# Patient Record
Sex: Female | Born: 1940 | Race: White | Hispanic: No | Smoking: Current every day smoker
Health system: Southern US, Community
[De-identification: ages and names within clinical notes are randomized; demographics above are authoritative.]

## PROBLEM LIST (undated history)

## (undated) DIAGNOSIS — I2510 Atherosclerotic heart disease of native coronary artery without angina pectoris: Secondary | ICD-10-CM

## (undated) DIAGNOSIS — E7800 Pure hypercholesterolemia, unspecified: Secondary | ICD-10-CM

## (undated) DIAGNOSIS — H25013 Cortical age-related cataract, bilateral: Secondary | ICD-10-CM

## (undated) DIAGNOSIS — F419 Anxiety disorder, unspecified: Secondary | ICD-10-CM

## (undated) DIAGNOSIS — R0600 Dyspnea, unspecified: Secondary | ICD-10-CM

## (undated) DIAGNOSIS — N1831 Chronic kidney disease, stage 3a: Secondary | ICD-10-CM

## (undated) DIAGNOSIS — E039 Hypothyroidism, unspecified: Secondary | ICD-10-CM

## (undated) DIAGNOSIS — I779 Disorder of arteries and arterioles, unspecified: Secondary | ICD-10-CM

## (undated) DIAGNOSIS — G893 Neoplasm related pain (acute) (chronic): Secondary | ICD-10-CM

## (undated) DIAGNOSIS — L409 Psoriasis, unspecified: Secondary | ICD-10-CM

## (undated) DIAGNOSIS — L4050 Arthropathic psoriasis, unspecified: Secondary | ICD-10-CM

## (undated) DIAGNOSIS — C4490 Unspecified malignant neoplasm of skin, unspecified: Secondary | ICD-10-CM

## (undated) HISTORY — DX: Arthropathic psoriasis, unspecified: L40.50

## (undated) HISTORY — DX: Pure hypercholesterolemia, unspecified: E78.00

## (undated) HISTORY — DX: Psoriasis, unspecified: L40.9

## (undated) HISTORY — PX: VAGINAL HYSTERECTOMY: SHX2639

## (undated) HISTORY — DX: Hypothyroidism, unspecified: E03.9

## (undated) HISTORY — DX: Neoplasm related pain (acute) (chronic): G89.3

## (undated) HISTORY — PX: CHOLECYSTECTOMY: SHX55

## (undated) HISTORY — DX: Unspecified malignant neoplasm of skin, unspecified: C44.90

## (undated) HISTORY — DX: Atherosclerotic heart disease of native coronary artery without angina pectoris: I25.10

---
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
## 1984-04-10 HISTORY — PX: LIVER BIOPSY: SHX301

## 1986-04-10 HISTORY — PX: CARPAL TUNNEL RELEASE: SHX101

## 1995-04-11 HISTORY — PX: STRABISMUS SURGERY: SHX218

## 1995-04-11 HISTORY — PX: CATARACT EXTRACTION, BILATERAL: SHX1313

## 2004-09-13 ENCOUNTER — Ambulatory Visit: Payer: Self-pay | Admitting: Internal Medicine

## 2005-11-21 ENCOUNTER — Ambulatory Visit: Payer: Self-pay | Admitting: Internal Medicine

## 2006-01-02 ENCOUNTER — Ambulatory Visit: Payer: Self-pay | Admitting: Internal Medicine

## 2006-04-10 HISTORY — PX: COLONOSCOPY: SHX174

## 2006-05-09 ENCOUNTER — Ambulatory Visit: Payer: Self-pay | Admitting: Unknown Physician Specialty

## 2007-01-09 ENCOUNTER — Ambulatory Visit: Payer: Self-pay | Admitting: Internal Medicine

## 2008-03-26 ENCOUNTER — Ambulatory Visit: Payer: Self-pay | Admitting: Internal Medicine

## 2009-02-25 ENCOUNTER — Ambulatory Visit: Payer: Self-pay | Admitting: Ophthalmology

## 2009-03-11 ENCOUNTER — Ambulatory Visit: Payer: Self-pay | Admitting: Ophthalmology

## 2009-04-27 ENCOUNTER — Ambulatory Visit: Payer: Self-pay | Admitting: Internal Medicine

## 2009-05-04 ENCOUNTER — Ambulatory Visit: Payer: Self-pay | Admitting: Ophthalmology

## 2010-03-22 ENCOUNTER — Ambulatory Visit: Payer: Self-pay | Admitting: Internal Medicine

## 2010-05-02 ENCOUNTER — Ambulatory Visit: Payer: Self-pay | Admitting: Internal Medicine

## 2010-05-20 ENCOUNTER — Ambulatory Visit: Payer: Self-pay | Admitting: Unknown Physician Specialty

## 2010-12-26 ENCOUNTER — Ambulatory Visit: Payer: Self-pay | Admitting: Internal Medicine

## 2011-05-15 ENCOUNTER — Ambulatory Visit: Payer: Self-pay | Admitting: Internal Medicine

## 2011-06-22 ENCOUNTER — Ambulatory Visit: Payer: Self-pay | Admitting: Internal Medicine

## 2013-05-19 ENCOUNTER — Ambulatory Visit: Payer: Self-pay | Admitting: Internal Medicine

## 2014-03-03 DIAGNOSIS — E0500 Thyrotoxicosis with diffuse goiter without thyrotoxic crisis or storm: Secondary | ICD-10-CM | POA: Insufficient documentation

## 2014-03-03 DIAGNOSIS — L4050 Arthropathic psoriasis, unspecified: Secondary | ICD-10-CM | POA: Insufficient documentation

## 2014-03-03 DIAGNOSIS — E7800 Pure hypercholesterolemia, unspecified: Secondary | ICD-10-CM | POA: Insufficient documentation

## 2014-03-11 ENCOUNTER — Ambulatory Visit: Payer: Self-pay | Admitting: Internal Medicine

## 2014-03-20 ENCOUNTER — Ambulatory Visit: Payer: Self-pay | Admitting: Internal Medicine

## 2014-04-23 DIAGNOSIS — I779 Disorder of arteries and arterioles, unspecified: Secondary | ICD-10-CM | POA: Insufficient documentation

## 2014-04-23 DIAGNOSIS — Z720 Tobacco use: Secondary | ICD-10-CM | POA: Insufficient documentation

## 2014-06-29 ENCOUNTER — Ambulatory Visit: Payer: Self-pay | Admitting: Internal Medicine

## 2015-04-27 ENCOUNTER — Other Ambulatory Visit: Payer: Self-pay | Admitting: Internal Medicine

## 2015-04-27 DIAGNOSIS — Z1231 Encounter for screening mammogram for malignant neoplasm of breast: Secondary | ICD-10-CM

## 2016-04-10 HISTORY — PX: COLONOSCOPY: SHX174

## 2016-04-10 HISTORY — PX: OTHER SURGICAL HISTORY: SHX169

## 2017-05-29 ENCOUNTER — Other Ambulatory Visit: Payer: Self-pay | Admitting: Internal Medicine

## 2017-05-29 DIAGNOSIS — E038 Other specified hypothyroidism: Secondary | ICD-10-CM | POA: Insufficient documentation

## 2017-05-29 DIAGNOSIS — Z1239 Encounter for other screening for malignant neoplasm of breast: Secondary | ICD-10-CM

## 2017-06-18 ENCOUNTER — Ambulatory Visit
Admission: RE | Admit: 2017-06-18 | Discharge: 2017-06-18 | Disposition: A | Discharge status: Home / Self Care | Payer: Medicare Other | Source: Ambulatory Visit | Attending: Internal Medicine | Admitting: Internal Medicine

## 2017-06-18 DIAGNOSIS — Z1231 Encounter for screening mammogram for malignant neoplasm of breast: Secondary | ICD-10-CM | POA: Insufficient documentation

## 2017-06-18 DIAGNOSIS — Z1239 Encounter for other screening for malignant neoplasm of breast: Secondary | ICD-10-CM

## 2020-04-08 ENCOUNTER — Other Ambulatory Visit: Payer: Self-pay | Admitting: Rheumatology

## 2020-04-08 DIAGNOSIS — R0781 Pleurodynia: Secondary | ICD-10-CM

## 2020-04-08 DIAGNOSIS — M546 Pain in thoracic spine: Secondary | ICD-10-CM

## 2020-04-22 ENCOUNTER — Ambulatory Visit
Admission: RE | Admit: 2020-04-22 | Discharge: 2020-04-22 | Disposition: A | Discharge status: Home / Self Care | Payer: Medicare PPO | Source: Ambulatory Visit | Attending: Rheumatology | Admitting: Rheumatology

## 2020-04-22 ENCOUNTER — Other Ambulatory Visit: Payer: Self-pay

## 2020-04-22 DIAGNOSIS — M546 Pain in thoracic spine: Secondary | ICD-10-CM

## 2020-04-22 DIAGNOSIS — R0781 Pleurodynia: Secondary | ICD-10-CM | POA: Diagnosis present

## 2020-04-22 DIAGNOSIS — C801 Malignant (primary) neoplasm, unspecified: Secondary | ICD-10-CM

## 2020-04-22 HISTORY — DX: Malignant (primary) neoplasm, unspecified: C80.1

## 2020-04-26 ENCOUNTER — Encounter: Payer: Self-pay | Admitting: Rheumatology

## 2020-05-03 ENCOUNTER — Inpatient Hospital Stay: Payer: Medicare PPO

## 2020-05-03 ENCOUNTER — Encounter: Payer: Self-pay | Admitting: Oncology

## 2020-05-03 ENCOUNTER — Inpatient Hospital Stay: Payer: Medicare PPO | Attending: Oncology | Admitting: Oncology

## 2020-05-03 ENCOUNTER — Other Ambulatory Visit: Payer: Self-pay

## 2020-05-03 VITALS — BP 152/92 | HR 89 | Temp 99.1°F | Resp 18 | Wt 151.0 lb

## 2020-05-03 DIAGNOSIS — G893 Neoplasm related pain (acute) (chronic): Secondary | ICD-10-CM

## 2020-05-03 DIAGNOSIS — R918 Other nonspecific abnormal finding of lung field: Secondary | ICD-10-CM | POA: Diagnosis present

## 2020-05-03 LAB — COMPREHENSIVE METABOLIC PANEL
ALT: 11 U/L (ref 0–44)
AST: 14 U/L — ABNORMAL LOW (ref 15–41)
Albumin: 3.6 g/dL (ref 3.5–5.0)
Alkaline Phosphatase: 92 U/L (ref 38–126)
Anion gap: 10 (ref 5–15)
BUN: 15 mg/dL (ref 8–23)
CO2: 27 mmol/L (ref 22–32)
Calcium: 9 mg/dL (ref 8.9–10.3)
Chloride: 102 mmol/L (ref 98–111)
Creatinine, Ser: 1.12 mg/dL — ABNORMAL HIGH (ref 0.44–1.00)
GFR, Estimated: 50 mL/min — ABNORMAL LOW (ref >60)
Glucose, Bld: 111 mg/dL — ABNORMAL HIGH (ref 70–99)
Potassium: 4.4 mmol/L (ref 3.5–5.1)
Sodium: 139 mmol/L (ref 135–145)
Total Bilirubin: 0.5 mg/dL (ref 0.3–1.2)
Total Protein: 8 g/dL (ref 6.5–8.1)

## 2020-05-03 LAB — CBC WITH DIFFERENTIAL/PLATELET
Abs Immature Granulocytes: 0.07 10*3/uL (ref 0.00–0.07)
Basophils Absolute: 0.1 10*3/uL (ref 0.0–0.1)
Basophils Relative: 1 %
Eosinophils Absolute: 0.5 10*3/uL (ref 0.0–0.5)
Eosinophils Relative: 4 %
HCT: 38.5 % (ref 36.0–46.0)
Hemoglobin: 12.3 g/dL (ref 12.0–15.0)
Immature Granulocytes: 1 %
Lymphocytes Relative: 14 %
Lymphs Abs: 1.8 10*3/uL (ref 0.7–4.0)
MCH: 27.8 pg (ref 26.0–34.0)
MCHC: 31.9 g/dL (ref 30.0–36.0)
MCV: 87.1 fL (ref 80.0–100.0)
Monocytes Absolute: 0.8 10*3/uL (ref 0.1–1.0)
Monocytes Relative: 7 %
Neutro Abs: 9 10*3/uL — ABNORMAL HIGH (ref 1.7–7.7)
Neutrophils Relative %: 73 %
Platelets: 181 10*3/uL (ref 150–400)
RBC: 4.42 MIL/uL (ref 3.87–5.11)
RDW: 13.2 % (ref 11.5–15.5)
WBC: 12.2 10*3/uL — ABNORMAL HIGH (ref 4.0–10.5)
nRBC: 0 % (ref 0.0–0.2)

## 2020-05-03 MED ORDER — PANTOPRAZOLE SODIUM 20 MG PO TBEC: 20.0000 mg | DELAYED_RELEASE_TABLET | Freq: Every day | ORAL | 1 refills | Status: DC

## 2020-05-03 MED ORDER — DEXAMETHASONE 4 MG PO TABS: 4.0000 mg | ORAL_TABLET | Freq: Two times a day (BID) | ORAL | 1 refills | Status: DC

## 2020-05-03 NOTE — Progress Notes (Signed)
Hematology/Oncology Consult note [facility] Telephone:([phone] [phone]  Patient Care Team: Baxter Hire, MD as PCP - General (Internal Medicine)   Name of the patient: Kylie Washington  [id number]  [birth year]    Reason for referral-lung mass   Referring physician-Dr. Edwina Barth  Date of visit: 05/03/20    Diagnosis-lung mass  Chief complaint/ Reason for visit-Dr. Edwina Barth  Heme/Onc history: Patient is a 80 year old female with a past medical history significant for psoriatic arthritis and hypothyroidism.  She presented with symptoms of Mid back pain which prompted x-rays which led to CT scan.  CT chest without contrast showed subpleural mass in the posterior medial left upper lobe measuring 5.4 x 3.2 x 7.9 cm.  Tumor extends into left T2-T3, T3-T4 and T4-T5 neural foramina.  Mass-effect upon the cord.  Several adjacent satellite nodules identified in the paravertebral left upper lobe.  Bilateral low-attenuation adrenal nodules.  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  Patient is currently on tramadol and uses mainly ibuprofen for her back pain.  She was also prescribed Percocet by Dr. Jefm Bryant but she has not started using it.  Patient denies any numbness around her chest wall.  Denies any weakness in her extremities.  Pain mainly involves around her back and sometimes radiates down to the front and under her left breast.  Has occasional left shoulder pain.    ECOG PS- 1  Pain scale- 5   Review of systems- Review of Systems  Constitutional: Negative for chills, fever, malaise/fatigue and weight loss.  HENT: Negative for congestion, ear discharge and nosebleeds.   Eyes: Negative for blurred vision.  Respiratory: Negative for cough, hemoptysis, sputum production, shortness of breath and wheezing.   Cardiovascular: Negative for chest pain, palpitations, orthopnea and claudication.  Gastrointestinal: Negative for  abdominal pain, blood in stool, constipation, diarrhea, heartburn, melena, nausea and vomiting.  Genitourinary: Negative for dysuria, flank pain, frequency, hematuria and urgency.  Musculoskeletal: Positive for back pain. Negative for joint pain and myalgias.  Skin: Negative for rash.  Neurological: Negative for dizziness, tingling, focal weakness, seizures, weakness and headaches.  Endo/Heme/Allergies: Does not bruise/bleed easily.  Psychiatric/Behavioral: Negative for depression and suicidal ideas. The patient does not have insomnia.     Allergies  Allergen Reactions  . Aurothioglucose Other (See Comments)    Granulocytopenia  . Codeine Nausea Only and Nausea And Vomiting  . Methotrexate Derivatives     Liver enlargement  . Penicillins     Fainting   . Sulfa Antibiotics Nausea Only    Patient Active Problem List   Diagnosis Date Noted  . Other specified hypothyroidism 05/29/2017  . Bilateral carotid artery disease ([place]) 04/23/2014  . Current tobacco use 04/23/2014  . Psoriatic arthritis ([place]) 03/03/2014  . Hypercholesterolemia 03/03/2014  . Graves disease 03/03/2014     Past Medical History:  Diagnosis Date  . Coronary artery disease   . Hypercholesteremia   . Hypothyroidism   . Psoriasis   . Psoriatic arthritis [facility])      Past Surgical History:  Procedure Laterality Date  . APPENDECTOMY     1945  . CARPAL TUNNEL RELEASE  1988  . CATARACT EXTRACTION, BILATERAL  1997  . CHOLECYSTECTOMY    . colonopscopy  2018  . COLONOSCOPY  2008  . LIVER BIOPSY  1986  . TONSILLECTOMY  1945    Social History   Socioeconomic History  . Marital status: Married    Spouse name: Not on file  . Number of  children: Not on file  . Years of education: Not on file  . Highest education level: Not on file  Occupational History  . Not on file  Tobacco Use  . Smoking status: Current Some Day Smoker    Years: 49.00    Types: Cigarettes  . Smokeless tobacco: Never Used   Substance and Sexual Activity  . Alcohol use: Not on file  . Drug use: Not on file  . Sexual activity: Not on file  Other Topics Concern  . Not on file  Social History Narrative  . Not on file   Social Determinants of Health   Financial Resource Strain: Not on file  Food Insecurity: Not on file  Transportation Needs: Not on file  Physical Activity: Not on file  Stress: Not on file  Social Connections: Not on file  Intimate Partner Violence: Not on file     History reviewed. No pertinent family history.   Current Outpatient Medications:  .  dexamethasone (DECADRON) 4 MG tablet, Take 1 tablet (4 mg total) by mouth 2 (two) times daily., Disp: 60 tablet, Rfl: 1 .  ibuprofen (ADVIL) 400 MG tablet, Take 400 mg by mouth every 6 (six) hours as needed., Disp: , Rfl:  .  levothyroxine (SYNTHROID) 88 MCG tablet, TAKE 1 TABLET BY MOUTH ONCE DAILY ON AN EMPTY STOMACH WITH A GLASS OF WATER AT LEAST 30-60 MINUTES BEFORE BREAKFAST, Disp: , Rfl:  .  pantoprazole (PROTONIX) 20 MG tablet, Take 1 tablet (20 mg total) by mouth daily., Disp: 30 tablet, Rfl: 1 .  Artificial Tear Solution (SOOTHE HYDRATION) 1.25 % SOLN, Apply to eye., Disp: , Rfl:  .  fluocinonide gel (LIDEX) 0.05 %, Apply topically., Disp: , Rfl:    Physical exam:  Vitals:   05/03/20 1347  BP: (!) 152/92  Pulse: 89  Resp: 18  Temp: 99.1 F (37.3 C)  TempSrc: Tympanic  SpO2: 98%  Weight: 151 lb (68.5 kg)   Physical Exam Eyes:     Extraocular Movements: EOM normal.  Cardiovascular:     Rate and Rhythm: Normal rate and regular rhythm.     Heart sounds: Normal heart sounds.  Pulmonary:     Effort: Pulmonary effort is normal.     Breath sounds: Normal breath sounds.  Abdominal:     General: Bowel sounds are normal.     Palpations: Abdomen is soft.  Skin:    General: Skin is warm and dry.  Neurological:     General: No focal deficit present.     Mental Status: She is alert and oriented to person, place, and time.      Sensory: No sensory deficit.     Motor: No weakness.        No flowsheet data found. CBC Latest Ref Rng & Units 05/03/2020  WBC 4.0 - 10.5 K/uL 12.2(H)  Hemoglobin 12.0 - 15.0 g/dL 12.3  Hematocrit 36.0 - 46.0 % 38.5  Platelets 150 - 400 K/uL 181    No images are attached to the encounter.  CT CHEST WO CONTRAST  Result Date: 04/22/2020 CLINICAL DATA:  Mid to upper thoracic spine pain. EXAM: CT CHEST WITHOUT CONTRAST TECHNIQUE: Multidetector CT imaging of the chest was performed following the standard protocol without IV contrast. COMPARISON:  None. FINDINGS: Cardiovascular: Normal heart size. No pericardial effusion. Aortic atherosclerosis. Coronary artery calcifications. Mediastinum/Nodes: Thyroid gland appears atrophic. The trachea appears patent and is midline. Normal appearance of the esophagus. No axillary, supraclavicular adenopathy. Prominent AP window lymph node  measures 1.1 cm, image 57/2. Lungs/Pleura: There is a small right pleural effusion. There is a subpleural mass within the posteromedial left upper lobe which measures 5.4 x 3.2 by 7.9 cm. Tumor extends into the left T2/T3, T3-4, and T4-5 neural foramina. FDG dural tumor is identified along the left side of the canal which has mass effect upon the cord. This is most pronounced at the T3-4 level, image 31/2. Several adjacent satellite nodules are identified within the paravertebral left upper lobe, image 42/3 and image 46/3. Mild pleural thickening overlying the posterolateral right upper lobe is identified, nonspecific. This measures up to 4 mm in thickness. Upper Abdomen: Bilateral low-attenuation adrenal nodules are identified suggestive of adenomas. Left adrenal gland nodule measures 1.9 cm and 2.59 Hounsfield units. Right adrenal nodule measures 1.1 cm and 2.6 Hounsfield units. Simple appearing cyst within the posteromedial right hepatic lobe measures 1.8 cm. Musculoskeletal: No aggressive lytic or sclerotic bone lesions  identified. IMPRESSION: 1. Subpleural mass within the paravertebral left upper lobe is identified with extension into the left T2-T3, T3-4, and T4-5 neural foramina. Tumor extends into the left side of the canal which has mass effect upon the cord at these levels. Finding is worrisome for primary bronchogenic carcinoma. Further investigation with contrast enhanced MRI of the thoracic spine is advised. A PET-CT may also be indicated for staging. 2. Small right pleural effusion. 3. Borderline enlarged AP window lymph node.  Nonspecific. 4. Bilateral adrenal gland adenomas. 5. Coronary artery calcifications noted. 6. Aortic atherosclerosis. Aortic Atherosclerosis (ICD10-I70.0). Critical Value/emergent results were called by telephone at the time of interpretation on 04/22/2020 at 3:16 pm to provider Liliane Bade. , who verbally acknowledged these results. Electronically Signed   By: Kerby Moors M.D.   On: 04/22/2020 15:17    Assessment and plan- Patient is a 80 y.o. female with left upper lobe lung mass noted on CT chest with involvement of T2-T5 neural foramina  Clinically patient does not have any evidence of cord compression although there is some evidence of mass-effect from the left upper lobe lung mass upon the cord.  For her pain I have advised her to take Percocet instead of ibuprofen and I will also start her on Decadron 4 mg twice daily along with Protonix 20 mg daily.  I will refer her to radiation oncology as well to get going with the work-up ASAP.  I have reviewed CT chest images independently and discussed findings with the patient and her husband.  Overall findings are concerning for lung cancer with locoregional involvement of the thoracic spinal cord.  We will plan to get a PET CT scan as well as MRI thoracic spine with and without contrast.  She will also need an MRI brain with and without contrast to complete her staging work-up.  She will need CT-guided biopsy of the left upper  lobe lung mass which can be done by interventional radiology.  I will see the patient back after the imaging and biopsy results are back to discuss further management   Thank you for this kind referral and the opportunity to participate in the care of this patient   Visit Diagnosis 1. Lung mass   2. Neoplasm related pain     Dr. Randa Evens, MD, MPH [facility] at [facility] [phone] 05/03/2020  3:07 PM

## 2020-05-04 ENCOUNTER — Other Ambulatory Visit: Payer: Self-pay | Admitting: *Deleted

## 2020-05-04 ENCOUNTER — Encounter: Payer: Self-pay | Admitting: *Deleted

## 2020-05-04 DIAGNOSIS — R918 Other nonspecific abnormal finding of lung field: Secondary | ICD-10-CM

## 2020-05-04 MED ORDER — LORAZEPAM 0.5 MG PO TABS: ORAL_TABLET | ORAL | 0 refills | Status: DC

## 2020-05-04 NOTE — Progress Notes (Signed)
  Oncology Nurse Navigator Documentation  Navigator Location: [facility] (05/04/20 1300)   )Navigator Encounter Type: Telephone (05/04/20 1300) Telephone: Kylie Washington Call;Patient Update;Appt Confirmation/Clarification (05/04/20 1300) Abnormal Finding Date: 03/18/20 (05/04/20 1300)                   Treatment Phase: Abnormal Scans (05/04/20 1300) Barriers/Navigation Needs: Coordination of Care;Pain (05/04/20 1300)   Interventions: Coordination of Care;Referrals (05/04/20 1300) Referrals: Radiation Oncology (05/04/20 1300) Coordination of Care: Appts;Radiology (05/04/20 1300)        Acuity: Level 2-Minimal Needs (1-2 Barriers Identified) (05/04/20 1300)    met patient briefly in the lobby yesterday, 05/03/20, to introduce to navigator services and answer any remained questions. At that time, pt did not have any quesitions. Contact info given and instructed to call with any questions. Pt made aware that will be in contact with her appts once scheduled.   Spoke with pt by phone today to review upcoming appts. All questions answered during call. Informed pt that will follow up with her at her visit tomorrow. Pt verbalized understanding. Nothing further needed at this time.     Time Spent with Patient: 60 (05/04/20 1300)

## 2020-05-05 ENCOUNTER — Other Ambulatory Visit: Payer: Self-pay

## 2020-05-05 ENCOUNTER — Ambulatory Visit
Admission: RE | Admit: 2020-05-05 | Discharge: 2020-05-05 | Disposition: A | Discharge status: Home / Self Care | Payer: Medicare PPO | Source: Ambulatory Visit | Attending: Radiation Oncology | Admitting: Radiation Oncology

## 2020-05-05 ENCOUNTER — Encounter: Payer: Self-pay | Admitting: Radiation Oncology

## 2020-05-05 ENCOUNTER — Encounter: Payer: Self-pay | Admitting: *Deleted

## 2020-05-05 VITALS — BP 140/85 | HR 81 | Temp 97.3°F | Resp 18 | Wt 151.1 lb

## 2020-05-05 DIAGNOSIS — E7800 Pure hypercholesterolemia, unspecified: Secondary | ICD-10-CM | POA: Insufficient documentation

## 2020-05-05 DIAGNOSIS — M546 Pain in thoracic spine: Secondary | ICD-10-CM | POA: Insufficient documentation

## 2020-05-05 DIAGNOSIS — E039 Hypothyroidism, unspecified: Secondary | ICD-10-CM | POA: Diagnosis not present

## 2020-05-05 DIAGNOSIS — I2510 Atherosclerotic heart disease of native coronary artery without angina pectoris: Secondary | ICD-10-CM | POA: Diagnosis not present

## 2020-05-05 DIAGNOSIS — Z79899 Other long term (current) drug therapy: Secondary | ICD-10-CM | POA: Diagnosis not present

## 2020-05-05 DIAGNOSIS — F17210 Nicotine dependence, cigarettes, uncomplicated: Secondary | ICD-10-CM | POA: Diagnosis not present

## 2020-05-05 DIAGNOSIS — R918 Other nonspecific abnormal finding of lung field: Secondary | ICD-10-CM | POA: Diagnosis present

## 2020-05-05 NOTE — Progress Notes (Signed)
  Oncology Nurse Navigator Documentation  Navigator Location: [facility] (05/05/20 1000)   )Navigator Encounter Type: Initial RadOnc (05/05/20 1000)                       Treatment Phase: Pre-Tx/Tx Discussion (05/05/20 1000) Barriers/Navigation Needs: Coordination of Care (05/05/20 1000)   Interventions: Coordination of Care (05/05/20 1000)   Coordination of Care: Appts;Radiology (05/05/20 1000)       met with patient during initial consult with Dr. Baruch Gouty to discuss radiation treatment. All questions answered during visit. Pt given print out of upcoming appts. Appts reviewed with patient. Informed that she will be notified by phone once scheduled for biopsy and follow up visit with Dr. Janese Banks. Nothing further needed at this time. Instructed pt to call with any further questions or needs. Pt verbalized understanding.            Time Spent with Patient: 60 (05/05/20 1000)

## 2020-05-05 NOTE — Consult Note (Signed)
NEW PATIENT EVALUATION  Name: Kylie Washington  MRN: [id number]  Date:   05/05/2020     DOB: October 13, 1940   This 80 y.o. 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  REFERRING PHYSICIAN: Baxter Hire, MD  CHIEF COMPLAINT:  Chief Complaint  Patient presents with  . Lung Cancer    Initial consultation    DIAGNOSIS: The encounter diagnosis was Lung mass.   PREVIOUS INVESTIGATIONS:  CT scans reviewed PET scan CT scan ordered MRI brain ordered Clinical notes reviewed Labs reviewed  HPI: Patient is a 80 year old female who presented with mid back pain which led to x-rays.  CT scan this month showed a subpleural mass within the paravertebral left upper lobe with extension and T2-T5 neuroforamina.  Tumor extended into the left side of the canal which has a mass-effect upon the cord at these levels findings worrisome for primary bronchogenic carcinoma.  She had a borderline enlarged AP window lymph nodes.  She has a PET CT scan MRI of brain ordered.  Biopsy will determine based on PET/CT findings to establish histologic diagnosis.  She is having no focal neurologic deficits no problems ambulating.  She continues to have mid back pain.  PLANNED TREATMENT REGIMEN: Probable concurrent chemoradiation  PAST MEDICAL HISTORY:  has a past medical history of Coronary artery disease, Hypercholesteremia, Hypothyroidism, Psoriasis, and Psoriatic arthritis ([place]).    PAST SURGICAL HISTORY:  Past Surgical History:  Procedure Laterality Date  . CARPAL TUNNEL RELEASE  1988  . CATARACT EXTRACTION, BILATERAL  1997  . CHOLECYSTECTOMY    . colonopscopy  2018  . COLONOSCOPY  2008  . LIVER BIOPSY  1986  . STRABISMUS SURGERY Left 1997  . TONSILLECTOMY  1945    FAMILY HISTORY: family history is not on file.  SOCIAL HISTORY:  reports that she has been smoking cigarettes. She has smoked for the past 49.00 years. She  has never used smokeless tobacco. She reports previous alcohol use. She reports that she does not use drugs.  ALLERGIES: Aurothioglucose, Codeine, Methotrexate derivatives, Penicillins, and Sulfa antibiotics  MEDICATIONS:  Current Outpatient Medications  Medication Sig Dispense Refill  . Artificial Tear Solution (SOOTHE HYDRATION) 1.25 % SOLN Apply to eye.    . fluocinonide gel (LIDEX) 0.05 % Apply topically.    Marland Kitchen ibuprofen (ADVIL) 400 MG tablet Take 400 mg by mouth every 6 (six) hours as needed.    Marland Kitchen levothyroxine (SYNTHROID) 88 MCG tablet TAKE 1 TABLET BY MOUTH ONCE DAILY ON AN EMPTY STOMACH WITH A GLASS OF WATER AT LEAST 30-60 MINUTES BEFORE BREAKFAST    . LORazepam (ATIVAN) 0.5 MG tablet Take 1 tablet by mouth prior to MRI scan 1 tablet 0  . pantoprazole (PROTONIX) 20 MG tablet Take 1 tablet (20 mg total) by mouth daily. 30 tablet 1  . dexamethasone (DECADRON) 4 MG tablet Take 1 tablet (4 mg total) by mouth 2 (two) times daily. (Patient not taking: Reported on 05/05/2020) 60 tablet 1   No current facility-administered medications for this encounter.    ECOG PERFORMANCE STATUS:  1 - Symptomatic but completely ambulatory  REVIEW OF SYSTEMS: Patient denies any weight loss, fatigue, weakness, fever, chills or night sweats. Patient denies any loss of vision, blurred vision. Patient denies any ringing  of the ears or hearing loss. No irregular heartbeat. Patient denies heart murmur or history of fainting. Patient denies any chest pain or pain radiating to her upper extremities.  Patient denies any shortness of breath, difficulty breathing at night, cough or hemoptysis. Patient denies any swelling in the lower legs. Patient denies any nausea vomiting, vomiting of blood, or coffee ground material in the vomitus. Patient denies any stomach pain. Patient states has had normal bowel movements no significant constipation or diarrhea. Patient denies any dysuria, hematuria or significant nocturia. Patient  denies any problems walking, swelling in the joints or loss of balance. Patient denies any skin changes, loss of hair or loss of weight. Patient denies any excessive worrying or anxiety or significant depression. Patient denies any problems with insomnia. Patient denies excessive thirst, polyuria, polydipsia. Patient denies any swollen glands, patient denies easy bruising or easy bleeding. Patient denies any recent infections, allergies or URI. Patient "s visual fields have not changed significantly in recent time.   PHYSICAL EXAM: BP 140/85 (BP Location: Left Arm, Patient Position: Sitting)   Pulse 81   Temp (!) 97.3 F (36.3 C) (Tympanic)   Resp 18   Wt 151 lb 1.6 oz (56.3 kg)  Pain is elicited on deep palpation of her thoracic spine.  Motor or sensory and DTR levels are equal and symmetric in upper lower extremities.  Well-developed well-nourished patient in NAD. HEENT reveals PERLA, EOMI, discs not visualized.  Oral cavity is clear. No oral mucosal lesions are identified. Neck is clear without evidence of cervical or supraclavicular adenopathy. Lungs are clear to A&P. Cardiac examination is essentially unremarkable with regular rate and rhythm without murmur rub or thrill. Abdomen is benign with no organomegaly or masses noted. Motor sensory and DTR levels are equal and symmetric in the upper and lower extremities. Cranial nerves II through XII are grossly intact. Proprioception is intact. No peripheral adenopathy or edema is identified. No motor or sensory levels are noted. Crude visual fields are within normal range.  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  I  would probably initially treat to 40 Gray in 20 fractions and reevaluate for response and possible small field boost.  In the meantime would like to review her PET CT scan MRI of brain to complete her staging.  Also would be interested in her histology.  Patient comprehends my recommendations well I personally set up and ordered CT simulation for early next week as I will have the PET/CT findings at that time and will start treatments next week based on her extensive disease of the thoracic spine.  I would like to take this opportunity to thank you for allowing me to participate in the care of your patient.Noreene Filbert, MD

## 2020-05-06 ENCOUNTER — Other Ambulatory Visit: Payer: Self-pay

## 2020-05-06 ENCOUNTER — Other Ambulatory Visit: Payer: Self-pay | Admitting: *Deleted

## 2020-05-06 ENCOUNTER — Ambulatory Visit
Admission: RE | Admit: 2020-05-06 | Discharge: 2020-05-06 | Disposition: A | Discharge status: Home / Self Care | Payer: Medicare PPO | Source: Ambulatory Visit | Attending: Oncology | Admitting: Oncology

## 2020-05-06 DIAGNOSIS — D3501 Benign neoplasm of right adrenal gland: Secondary | ICD-10-CM | POA: Insufficient documentation

## 2020-05-06 DIAGNOSIS — R918 Other nonspecific abnormal finding of lung field: Secondary | ICD-10-CM | POA: Diagnosis present

## 2020-05-06 DIAGNOSIS — I2510 Atherosclerotic heart disease of native coronary artery without angina pectoris: Secondary | ICD-10-CM | POA: Insufficient documentation

## 2020-05-06 DIAGNOSIS — J90 Pleural effusion, not elsewhere classified: Secondary | ICD-10-CM | POA: Diagnosis not present

## 2020-05-06 DIAGNOSIS — I700 Atherosclerosis of aorta: Secondary | ICD-10-CM | POA: Diagnosis not present

## 2020-05-06 DIAGNOSIS — M461 Sacroiliitis, not elsewhere classified: Secondary | ICD-10-CM | POA: Diagnosis not present

## 2020-05-06 DIAGNOSIS — K5730 Diverticulosis of large intestine without perforation or abscess without bleeding: Secondary | ICD-10-CM | POA: Diagnosis not present

## 2020-05-06 DIAGNOSIS — D3502 Benign neoplasm of left adrenal gland: Secondary | ICD-10-CM | POA: Insufficient documentation

## 2020-05-06 DIAGNOSIS — R599 Enlarged lymph nodes, unspecified: Secondary | ICD-10-CM | POA: Diagnosis not present

## 2020-05-06 LAB — GLUCOSE, CAPILLARY: Glucose-Capillary: 98 mg/dL (ref 70–99)

## 2020-05-06 MED ORDER — FLUDEOXYGLUCOSE F - 18 (FDG) INJECTION
7.8000 | Freq: Once | INTRAVENOUS | Status: AC | PRN
  Administered 2020-05-06: 7.361 via INTRAVENOUS

## 2020-05-06 NOTE — Progress Notes (Signed)
Orders for covid test on 1/31 prior to biopsy on 2/2

## 2020-05-09 ENCOUNTER — Ambulatory Visit
Admission: RE | Admit: 2020-05-09 | Discharge: 2020-05-09 | Disposition: A | Discharge status: Home / Self Care | Payer: Medicare PPO | Source: Ambulatory Visit | Attending: Oncology | Admitting: Oncology

## 2020-05-09 ENCOUNTER — Other Ambulatory Visit: Payer: Self-pay

## 2020-05-09 DIAGNOSIS — R918 Other nonspecific abnormal finding of lung field: Secondary | ICD-10-CM

## 2020-05-09 MED ORDER — GADOBUTROL 1 MMOL/ML IV SOLN
7.0000 mL | Freq: Once | INTRAVENOUS | Status: AC | PRN
  Administered 2020-05-09: 7 mL via INTRAVENOUS

## 2020-05-10 ENCOUNTER — Other Ambulatory Visit
Admission: RE | Admit: 2020-05-10 | Discharge: 2020-05-10 | Disposition: A | Discharge status: Home / Self Care | Payer: Medicare PPO | Source: Ambulatory Visit | Attending: Oncology | Admitting: Oncology

## 2020-05-10 ENCOUNTER — Encounter: Payer: Self-pay | Admitting: *Deleted

## 2020-05-10 ENCOUNTER — Ambulatory Visit
Admission: RE | Admit: 2020-05-10 | Discharge: 2020-05-10 | Disposition: A | Discharge status: Home / Self Care | Payer: Medicare PPO | Source: Ambulatory Visit | Attending: Radiation Oncology | Admitting: Radiation Oncology

## 2020-05-10 DIAGNOSIS — Z20822 Contact with and (suspected) exposure to covid-19: Secondary | ICD-10-CM | POA: Diagnosis not present

## 2020-05-10 DIAGNOSIS — Z01812 Encounter for preprocedural laboratory examination: Secondary | ICD-10-CM | POA: Diagnosis present

## 2020-05-10 DIAGNOSIS — C3412 Malignant neoplasm of upper lobe, left bronchus or lung: Secondary | ICD-10-CM | POA: Diagnosis not present

## 2020-05-10 LAB — SARS CORONAVIRUS 2 (TAT 6-24 HRS): SARS Coronavirus 2: NEGATIVE

## 2020-05-10 NOTE — Progress Notes (Signed)
Patient on schedule for CT Lung biopsy 05/12/2020, called and spoke with patient on phone with pre procedure instructions given. Made aware to be here @ 0930, NPO after Mn, and driver post procedure/recovery. Stated understanding.

## 2020-05-10 NOTE — Progress Notes (Signed)
  Oncology Nurse Navigator Documentation  Navigator Location: [facility] (05/10/20 1000)   )Navigator Encounter Type: Lobby (05/10/20 1000)                     Patient Visit Type: RadOnc (05/10/20 1000)   Barriers/Navigation Needs: Coordination of Care (05/10/20 1000)   Interventions: Coordination of Care (05/10/20 1000)   Coordination of Care: Appts;Radiology (05/10/20 1000)       met with patient and her husband in the lobby after CT simulation today. All questions answered during visit. Reviewed upcoming appts with pt for covid test today, biopsy on Wed 2/2, and follow up visit with Dr. Janese Banks on 2/8. Reassurance provided to patient. No further questions or needs at this time. Instructed pt to call back with any further questions or needs. Pt verbalized understanding.           Time Spent with Patient: 30 (05/10/20 1000)

## 2020-05-11 ENCOUNTER — Other Ambulatory Visit: Payer: Self-pay | Admitting: Student

## 2020-05-11 DIAGNOSIS — C3412 Malignant neoplasm of upper lobe, left bronchus or lung: Secondary | ICD-10-CM | POA: Insufficient documentation

## 2020-05-12 ENCOUNTER — Other Ambulatory Visit: Payer: Self-pay

## 2020-05-12 ENCOUNTER — Ambulatory Visit
Admission: RE | Admit: 2020-05-12 | Discharge: 2020-05-12 | Disposition: A | Discharge status: Home / Self Care | Payer: Medicare PPO | Source: Ambulatory Visit | Attending: Interventional Radiology | Admitting: Interventional Radiology

## 2020-05-12 ENCOUNTER — Ambulatory Visit
Admission: RE | Admit: 2020-05-12 | Discharge: 2020-05-12 | Disposition: A | Discharge status: Home / Self Care | Payer: Medicare PPO | Source: Ambulatory Visit | Attending: Oncology | Admitting: Oncology

## 2020-05-12 DIAGNOSIS — Z9889 Other specified postprocedural states: Secondary | ICD-10-CM | POA: Insufficient documentation

## 2020-05-12 DIAGNOSIS — R918 Other nonspecific abnormal finding of lung field: Secondary | ICD-10-CM | POA: Diagnosis not present

## 2020-05-12 LAB — CBC
HCT: 38.3 % (ref 36.0–46.0)
Hemoglobin: 12.1 g/dL (ref 12.0–15.0)
MCH: 27.5 pg (ref 26.0–34.0)
MCHC: 31.6 g/dL (ref 30.0–36.0)
MCV: 87 fL (ref 80.0–100.0)
Platelets: 271 10*3/uL (ref 150–400)
RBC: 4.4 MIL/uL (ref 3.87–5.11)
RDW: 13.6 % (ref 11.5–15.5)
WBC: 15.3 10*3/uL — ABNORMAL HIGH (ref 4.0–10.5)
nRBC: 0 % (ref 0.0–0.2)

## 2020-05-12 LAB — PROTIME-INR
INR: 1.1 (ref 0.8–1.2)
Prothrombin Time: 13.5 seconds (ref 11.4–15.2)

## 2020-05-12 MED ORDER — ACETAMINOPHEN 325 MG PO TABS
ORAL_TABLET | ORAL | Status: AC
  Filled 2020-05-12: qty 2

## 2020-05-12 MED ORDER — MIDAZOLAM HCL 2 MG/2ML IJ SOLN
INTRAMUSCULAR | Status: AC
  Filled 2020-05-12: qty 2

## 2020-05-12 MED ORDER — SODIUM CHLORIDE 0.9 % IV SOLN: INTRAVENOUS | Status: DC

## 2020-05-12 MED ORDER — ACETAMINOPHEN 325 MG PO TABS
650.0000 mg | ORAL_TABLET | Freq: Once | ORAL | Status: AC
  Administered 2020-05-12: 650 mg via ORAL
  Filled 2020-05-12: qty 2

## 2020-05-12 MED ORDER — MIDAZOLAM HCL 2 MG/2ML IJ SOLN
INTRAMUSCULAR | Status: AC | PRN
Start: 2020-05-12 — End: 2020-05-12
  Administered 2020-05-12 (×2): 1 mg via INTRAVENOUS

## 2020-05-12 MED ORDER — FENTANYL CITRATE (PF) 100 MCG/2ML IJ SOLN
INTRAMUSCULAR | Status: AC | PRN
  Administered 2020-05-12 (×2): 50 ug via INTRAVENOUS

## 2020-05-12 MED ORDER — FENTANYL CITRATE (PF) 100 MCG/2ML IJ SOLN
INTRAMUSCULAR | Status: AC
  Filled 2020-05-12: qty 2

## 2020-05-12 NOTE — Progress Notes (Signed)
Patient clinically stable post left upper lobe lung biopsy per Dr Pascal Lux, tolerated well. NPO til CXR 1330, then po's if ok. Awake/alert and oriented post procedure. Received Versed 2 mg along with Fentanyl 100 mcg IV for procedure. Denies complaints at this time. Report given to Fransico Michael Rn post procedure. Called and spoke with daughter/Julie with questions answered and update given post procedure.

## 2020-05-12 NOTE — Consult Note (Signed)
Chief Complaint: Hypermetabolic left upper lobe pulmonary mass.  Referring Physician(s): Rao,Archana C  Patient Status: [facility] - Out-pt  History of Present Illness: Kylie Washington is a 80 y.o. female with past medical history significant for CAD, hypothyroidism, hyperlipidemia, psoriatic arthritis and smoking who presents today for CT-guided biopsy of hypermetabolic mass involving the medial aspect of the left upper and lower lobes.  Patient remains without complaint.  Specifically, no chest pain, shortness of breath, fever or chills.  No unintentional weight loss or weight gain.  Past Medical History:  Diagnosis Date  . 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 Coronary artery disease   . Hypercholesteremia   . Hypothyroidism   . Psoriasis   . Psoriatic arthritis [facility])     Past Surgical History:  Procedure Laterality Date  . CARPAL TUNNEL RELEASE  1988  . CATARACT EXTRACTION, BILATERAL  1997  . CHOLECYSTECTOMY    . colonopscopy  2018  . COLONOSCOPY  2008  . LIVER BIOPSY  1986  . STRABISMUS SURGERY Left 1997  . TONSILLECTOMY  1945    Allergies: Aurothioglucose, Codeine, Methotrexate derivatives, Penicillins, and Sulfa antibiotics  Medications: Prior to Admission medications   Medication Sig Start Date End Date Taking? Authorizing Provider  dexamethasone (DECADRON) 4 MG tablet Take 1 tablet (4 mg total) by mouth 2 (two) times daily. 05/03/20  Yes Sindy Guadeloupe, MD  fluocinonide gel (LIDEX) 0.05 % Apply topically.   Yes [provider]  ibuprofen (ADVIL) 400 MG tablet Take 400 mg by mouth every 6 (six) hours as needed.   Yes [provider]  levothyroxine (SYNTHROID) 88 MCG tablet TAKE 1 TABLET BY MOUTH ONCE DAILY ON AN EMPTY STOMACH WITH A GLASS OF WATER AT LEAST 30-60 MINUTES BEFORE BREAKFAST 02/23/20  Yes [provider]  Artificial Tear Solution (SOOTHE HYDRATION) 1.25 % SOLN Apply to eye.    [provider]  LORazepam (ATIVAN) 0.5  MG tablet Take 1 tablet by mouth prior to MRI scan 05/04/20   Sindy Guadeloupe, MD  pantoprazole (PROTONIX) 20 MG tablet Take 1 tablet (20 mg total) by mouth daily. Patient not taking: Reported on 05/12/2020 05/03/20   Sindy Guadeloupe, MD     History reviewed. No pertinent family history.  Social History   Socioeconomic History  . Marital status: Married    Spouse name: [name]   . Number of children: 2  . Years of education: Not on file  . Highest education level: Not on file  Occupational History  . Occupation: retired     Comment: [place] [employer]   Tobacco Use  . Smoking status: Current Some Day Smoker    Years: 49.00    Types: Cigarettes  . Smokeless tobacco: Never Used  Substance and Sexual Activity  . Alcohol use: Not Currently  . Drug use: Never  . Sexual activity: Not on file  Other Topics Concern  . Not on file  Social History Narrative   Lives at home with spouse 33 years   Social Determinants of Health   Financial Resource Strain: Not on file  Food Insecurity: Not on file  Transportation Needs: Not on file  Physical Activity: Not on file  Stress: Not on file  Social Connections: Not on file    ECOG Status: 0 - Asymptomatic  Review of Systems: A 12 point ROS discussed and pertinent positives are indicated in the HPI above.  All other systems are negative.  Review of Systems  Vital Signs:  BP (!) 158/84   Pulse 74   Temp 98.5 F (36.9 C) (Oral)   Resp 20   Ht 5' 6.5" (1.689 m)   Wt 68.5 kg   SpO2 100%   BMI 24.01 kg/m   Physical Exam  Imaging: CT CHEST WO CONTRAST  Result Date: 04/22/2020 CLINICAL DATA:  Mid to upper thoracic spine pain. EXAM: CT CHEST WITHOUT CONTRAST TECHNIQUE: Multidetector CT imaging of the chest was performed following the standard protocol without IV contrast. COMPARISON:  None. FINDINGS: Cardiovascular: Normal heart size. No pericardial effusion. Aortic atherosclerosis. Coronary artery calcifications. Mediastinum/Nodes: Thyroid  gland appears atrophic. The trachea appears patent and is midline. Normal appearance of the esophagus. No axillary, supraclavicular adenopathy. Prominent AP window lymph node measures 1.1 cm, image 57/2. Lungs/Pleura: There is a small right pleural effusion. There is a subpleural mass within the posteromedial left upper lobe which measures 5.4 x 3.2 by 7.9 cm. Tumor extends into the left T2/T3, T3-4, and T4-5 neural foramina. FDG dural tumor is identified along the left side of the canal which has mass effect upon the cord. This is most pronounced at the T3-4 level, image 31/2. Several adjacent satellite nodules are identified within the paravertebral left upper lobe, image 42/3 and image 46/3. Mild pleural thickening overlying the posterolateral right upper lobe is identified, nonspecific. This measures up to 4 mm in thickness. Upper Abdomen: Bilateral low-attenuation adrenal nodules are identified suggestive of adenomas. Left adrenal gland nodule measures 1.9 cm and 2.59 Hounsfield units. Right adrenal nodule measures 1.1 cm and 2.6 Hounsfield units. Simple appearing cyst within the posteromedial right hepatic lobe measures 1.8 cm. Musculoskeletal: No aggressive lytic or sclerotic bone lesions identified. IMPRESSION: 1. Subpleural mass within the paravertebral left upper lobe is identified with extension into the left T2-T3, T3-4, and T4-5 neural foramina. Tumor extends into the left side of the canal which has mass effect upon the cord at these levels. Finding is worrisome for primary bronchogenic carcinoma. Further investigation with contrast enhanced MRI of the thoracic spine is advised. A PET-CT may also be indicated for staging. 2. Small right pleural effusion. 3. Borderline enlarged AP window lymph node.  Nonspecific. 4. Bilateral adrenal gland adenomas. 5. Coronary artery calcifications noted. 6. Aortic atherosclerosis. Aortic Atherosclerosis (ICD10-I70.0). Critical Value/emergent results were called by  telephone at the time of interpretation on 04/22/2020 at 3:16 pm to provider Liliane Bade. , who verbally acknowledged these results. Electronically Signed   By: Kerby Moors M.D.   On: 04/22/2020 15:17   MR Brain W Wo Contrast  Result Date: 05/09/2020 CLINICAL DATA:  Metastatic workup.  Lung mass. EXAM: MRI HEAD WITHOUT AND WITH CONTRAST TECHNIQUE: Multiplanar, multiecho pulse sequences of the brain and surrounding structures were obtained without and with intravenous contrast. CONTRAST:  29mL GADAVIST GADOBUTROL 1 MMOL/ML IV SOLN COMPARISON:  None. FINDINGS: Brain: No enhancement or swelling to suggest metastatic disease. No incidental infarct, hemorrhage, hydrocephalus, or collection. Age normal brain volume. Mild to moderate for age chronic small vessel ischemia in the deep cerebral white matter. Vascular: Normal flow voids and vascular enhancements. Skull and upper cervical spine: No noted metastatic disease. Degenerative facet spurring. Sinuses/Orbits: Symmetric prominent extraocular muscle thickness without discrete mass. The lateral rectus and obliques appear spared. No proptosis. Bilateral cataract resection. IMPRESSION: 1. No evidence of metastatic disease to the brain. 2. 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 Electronically Signed   By: Monte Fantasia M.D.   On: 05/09/2020 10:43   MR THORACIC SPINE  W WO CONTRAST  Result Date: 05/09/2020 CLINICAL DATA:  Evaluate possible lung mass involving the thoracic spine. EXAM: MRI THORACIC WITHOUT AND WITH CONTRAST TECHNIQUE: Multiplanar and multiecho pulse sequences of the thoracic spine were obtained without and with intravenous contrast. CONTRAST:  17mL GADAVIST GADOBUTROL 1 MMOL/ML IV SOLN COMPARISON:  Head CT from 3 days ago FINDINGS: Alignment:  Normal thoracic alignment. Vertebrae: There is altered marrow signal and probable infiltration of the lateral left vertebral bodies of T2-T4. The associated medial ribs  also show altered signal and probable involvement. There is definite infiltration into the left T2-3, T3-4, and T4-5 foramina, greatest at the level of T3-4 where there is epidural tumor displacing the thecal sac to the right. No evidence of hematogenous osseous spread. No cord edema. Cord: Thecal sac mass effect as noted above. No evidence of intramedullary metastasis. Paraspinal and other soft tissues: Left paraspinal mass with epicenter along the upper lobe/pleura as seen on prior PET-CT. Disc levels: Ordinary degenerative changes.  No degenerative impingement. IMPRESSION: The left chest mass infiltrates the T2-3, T3-4, and T4-5 left foramina with left-sided epidural tumor at T3-4 causing mass effect on the thecal sac. The left-sided T2-T4 vertebrae and medial ribs are likely also infiltrated. Electronically Signed   By: Monte Fantasia M.D.   On: 05/09/2020 10:41   NM PET Image Initial (PI) Skull Base To Thigh  Result Date: 05/06/2020 CLINICAL DATA:  Initial treatment strategy for lung mass. EXAM: NUCLEAR MEDICINE PET SKULL BASE TO THIGH TECHNIQUE: 7.4 mCi F-18 FDG was injected intravenously. Full-ring PET imaging was performed from the skull base to thigh after the radiotracer. CT data was obtained and used for attenuation correction and anatomic localization. Fasting blood glucose: 98 mg/dl COMPARISON:  Chest CT 04/22/2020 FINDINGS: Mediastinal blood pool activity: SUV max 2.6 Liver activity: SUV max 3.2 NECK: No significant abnormal hypermetabolic activity in this region. Incidental CT findings: Bilateral common carotid atherosclerotic calcification. CHEST: The primarily pleural-based/paravertebral mass along the left upper lobe extending from the apex down adjacent to the posterior arch is highly hypermetabolic compatible with malignancy, maximum SUV 12.2. Suspected neural foraminal extension and extension into the left side of the spinal canal at the T3-4 level. The AP window lymph node of concern on  prior chest CT has a maximum SUV of 3.8. Incidental CT findings: Coronary, aortic arch, and branch vessel atherosclerotic vascular disease. Focal anterior pericardial calcification, unchanged. Small right pleural effusion with adjacent atelectasis. Biapical pleuroparenchymal scarring. ABDOMEN/PELVIS: Located between the broad left transverse process of L5 and the adjacent posterior iliac crest, just anterior to the longissimus thoracis muscle and just above the sacroiliac joint, a 2.5 by 1.5 cm soft tissue density lesion on image 188 of series 3 is present with a maximum SUV of 7.0. Possibilities include metastatic lesion or schwannoma. Incidental CT findings: Right hepatic lobe cyst 1.9 cm in diameter on image 128 series 3. Aortoiliac atherosclerotic vascular disease. Small bilateral adrenal adenomas. Mild sigmoid colon diverticulosis. SKELETON: No significant abnormal hypermetabolic activity in this region. Incidental CT findings: Sclerosis along the SI joints compatible with bilateral sacroiliitis. IMPRESSION: 1. The left upper lobe pleural-based/paravertebral mass has a maximum SUV of 12.2, compatible with malignancy. Suspected extension into the spinal canal at the T3-4 level. Possibilities might include lung cancer, pleural tumor such as mesothelioma, or lymphoma. Mildly hypermetabolic and mildly enlarged AP window lymph node concerning for early spread/involvement. 2. Unusual moderately hypermetabolic lesion just above the left SI joint, maximum SUV 7.0, potentially from metastatic lesion  or schwannoma. 3. Other imaging findings of potential clinical significance: Aortic Atherosclerosis (ICD10-I70.0). Coronary atherosclerosis. Small right pleural effusion. Small bilateral adrenal adenomas. Mild sigmoid colon diverticulosis. Chronic bilateral sacroiliitis. Electronically Signed   By: Van Clines M.D.   On: 05/06/2020 09:42    Labs:  CBC: Recent Labs    05/03/20 1443 05/12/20 0932  WBC 12.2*  15.3*  HGB 12.3 12.1  HCT 38.5 38.3  PLT 181 271    COAGS: No results for input(s): INR, APTT in the last 8760 hours.  BMP: Recent Labs    05/03/20 1443  NA 139  K 4.4  CL 102  CO2 27  GLUCOSE 111*  BUN 15  CALCIUM 9.0  CREATININE 1.12*  GFRNONAA 50*    LIVER FUNCTION TESTS: Recent Labs    05/03/20 1443  BILITOT 0.5  AST 14*  ALT 11  ALKPHOS 92  PROT 8.0  ALBUMIN 3.6    TUMOR MARKERS: No results for input(s): AFPTM, CEA, CA199, CHROMGRNA in the last 8760 hours.  Assessment and Plan:  Kylie Washington is a 80 y.o. female with past medical history significant for CAD, hypothyroidism, hyperlipidemia, psoriatic arthritis and smoking who presents today for CT-guided biopsy of hypermetabolic mass involving the medial aspects of the left upper and lower lobes.  Risks and benefits of CT guided left upper lobe subpleural pulmonary mass biopsy was discussed with the patient including, but not limited to bleeding, hemoptysis, respiratory failure requiring intubation, infection, pneumothorax requiring chest tube placement, stroke from air embolism or even death.  All of the patient's questions were answered and the patient is agreeable to proceed.  Consent signed and in chart.    Thank you for this interesting consult.  I greatly enjoyed meeting Kylie Washington and look forward to participating in their care.  A copy of this report was sent to the requesting provider on this date.  Electronically Signed: Sandi Mariscal, MD 05/12/2020, 10:15 AM   I spent a total of 15 Minutes in face to face in clinical consultation, greater than 50% of which was counseling/coordinating care for CT-guided biopsy of hypermetabolic left upper lobe pulmonary mass.

## 2020-05-12 NOTE — Procedures (Signed)
Pre procedural Dx: Hypermetabolic pleural thickening along the medial aspect of the left upper lobe  Post procedural Dx: Same  Technically successful CT guided biopsy of hypermetabolic pleural thickening along the medial aspect of the left upper lobe   EBL: None.  Complications: None immediate.   Ronny Bacon, MD Pager #: [phone]

## 2020-05-13 ENCOUNTER — Telehealth: Payer: Self-pay | Admitting: *Deleted

## 2020-05-13 ENCOUNTER — Ambulatory Visit
Admission: RE | Admit: 2020-05-13 | Discharge: 2020-05-13 | Disposition: A | Discharge status: Home / Self Care | Payer: Medicare PPO | Source: Ambulatory Visit | Attending: Radiation Oncology | Admitting: Radiation Oncology

## 2020-05-13 ENCOUNTER — Other Ambulatory Visit: Payer: Self-pay | Admitting: *Deleted

## 2020-05-13 DIAGNOSIS — C3412 Malignant neoplasm of upper lobe, left bronchus or lung: Secondary | ICD-10-CM | POA: Diagnosis not present

## 2020-05-13 MED ORDER — HYDROCODONE-ACETAMINOPHEN 5-325 MG PO TABS: 1.0000 | ORAL_TABLET | Freq: Four times a day (QID) | ORAL | 0 refills | Status: DC | PRN

## 2020-05-13 NOTE — Telephone Encounter (Signed)
I spoke with patient this morning regarding her pain after her biopsy. Hydrocodone was sent in to her pharmacy and pt made aware this am. Pt stated that message left was from yesterday evening.

## 2020-05-13 NOTE — Telephone Encounter (Signed)
Can you call her? I can see her at 2.30 if need be

## 2020-05-13 NOTE — Telephone Encounter (Signed)
Message left on service line, writer just received this message. Daughter called to report that her mother was in severe pain at the biopsy site. She would like a call back.

## 2020-05-14 ENCOUNTER — Ambulatory Visit
Admission: RE | Admit: 2020-05-14 | Discharge: 2020-05-14 | Disposition: A | Discharge status: Home / Self Care | Payer: Medicare PPO | Source: Ambulatory Visit | Attending: Radiation Oncology | Admitting: Radiation Oncology

## 2020-05-14 DIAGNOSIS — C3412 Malignant neoplasm of upper lobe, left bronchus or lung: Secondary | ICD-10-CM | POA: Diagnosis not present

## 2020-05-17 ENCOUNTER — Ambulatory Visit
Admission: RE | Admit: 2020-05-17 | Discharge: 2020-05-17 | Disposition: A | Discharge status: Home / Self Care | Payer: Medicare PPO | Source: Ambulatory Visit | Attending: Radiation Oncology | Admitting: Radiation Oncology

## 2020-05-17 DIAGNOSIS — C3412 Malignant neoplasm of upper lobe, left bronchus or lung: Secondary | ICD-10-CM | POA: Diagnosis not present

## 2020-05-17 LAB — SURGICAL PATHOLOGY

## 2020-05-18 ENCOUNTER — Other Ambulatory Visit: Payer: Self-pay | Admitting: *Deleted

## 2020-05-18 ENCOUNTER — Encounter: Payer: Self-pay | Admitting: Oncology

## 2020-05-18 ENCOUNTER — Inpatient Hospital Stay: Payer: Medicare PPO | Attending: Oncology | Admitting: Oncology

## 2020-05-18 ENCOUNTER — Ambulatory Visit: Payer: Medicare PPO

## 2020-05-18 ENCOUNTER — Encounter: Payer: Self-pay | Admitting: *Deleted

## 2020-05-18 VITALS — BP 122/72 | HR 63 | Temp 98.4°F | Resp 20 | Wt 149.9 lb

## 2020-05-18 DIAGNOSIS — Z7952 Long term (current) use of systemic steroids: Secondary | ICD-10-CM | POA: Insufficient documentation

## 2020-05-18 DIAGNOSIS — F17210 Nicotine dependence, cigarettes, uncomplicated: Secondary | ICD-10-CM | POA: Insufficient documentation

## 2020-05-18 DIAGNOSIS — L4050 Arthropathic psoriasis, unspecified: Secondary | ICD-10-CM | POA: Insufficient documentation

## 2020-05-18 DIAGNOSIS — M549 Dorsalgia, unspecified: Secondary | ICD-10-CM | POA: Insufficient documentation

## 2020-05-18 DIAGNOSIS — E039 Hypothyroidism, unspecified: Secondary | ICD-10-CM | POA: Insufficient documentation

## 2020-05-18 DIAGNOSIS — R918 Other nonspecific abnormal finding of lung field: Secondary | ICD-10-CM

## 2020-05-18 DIAGNOSIS — M533 Sacrococcygeal disorders, not elsewhere classified: Secondary | ICD-10-CM

## 2020-05-18 NOTE — Progress Notes (Signed)
  Oncology Nurse Navigator Documentation  Navigator Location: [facility]-Med Onc (05/18/20 1400)   )Navigator Encounter Type: Follow-up Appt (05/18/20 1400)                     Patient Visit Type: MedOnc (05/18/20 1400)   Barriers/Navigation Needs: Coordination of Care (05/18/20 1400)   Interventions: Coordination of Care (05/18/20 1400)   Coordination of Care: Appts (05/18/20 1400)     met with patient during follow up visit with Dr. Janese Banks. All questions answered during visit. Will follow up with patient after discussion at tumor board to inform of next steps. Instructed pt to call with any further questions or needs. Pt verbalized understanding. Nothing further needed at this time.             Time Spent with Patient: 30 (05/18/20 1400)

## 2020-05-19 ENCOUNTER — Ambulatory Visit: Payer: Medicare PPO

## 2020-05-20 ENCOUNTER — Telehealth: Payer: Self-pay | Admitting: *Deleted

## 2020-05-20 ENCOUNTER — Other Ambulatory Visit: Payer: Medicare PPO

## 2020-05-20 ENCOUNTER — Ambulatory Visit: Payer: Medicare PPO

## 2020-05-20 NOTE — Telephone Encounter (Signed)
Phone call made to patient to review recommendations from case conference. Reviewed upcoming appts. All questions answered. Instructed pt to call with any further questions or needs. Pt verbalized understanding.

## 2020-05-20 NOTE — Progress Notes (Signed)
Tumor Board Documentation  Kylie Washington was presented by Dr Janese Banks at our Tumor Board on 05/20/2020, which included representatives from medical oncology,radiation oncology,surgical,radiology,pathology,navigation,internal medicine,pharmacy,genetics,research,palliative care,pulmonology.  Kylie Washington currently presents as a new patient,for [facility],for new positive pathology with history of the following treatments: active survellience,surgical intervention(s) (Steroids). 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  Additionally, we reviewed previous medical and familial history, history of present illness, and recent lab results along with all available histopathologic and imaging studies. The tumor board considered available treatment options and made the following recommendations: Biopsy,Radiation therapy (primary modality) (Iliac Lesion)    The following procedures/referrals were also placed: No orders of the defined types were placed in this encounter.   Clinical Trial Status: not discussed   Staging used: To be determined  National site-specific guidelines   were discussed with respect to the case.  Tumor board is a meeting of clinicians from various specialty areas who evaluate and discuss patients for whom a multidisciplinary approach is being considered. Final determinations in the plan of care are those of the provider(s). The responsibility for follow up of recommendations given during tumor board is that of the provider.   Today's extended care, comprehensive team conference, Kylie Washington was not present for the discussion and was not examined.   Multidisciplinary Tumor Board is a multidisciplinary case peer review process.  Decisions discussed in the Multidisciplinary Tumor Board reflect the opinions of the specialists present at the conference without having examined the patient.  Ultimately, treatment and diagnostic decisions rest with the primary provider(s) and the  patient.

## 2020-05-21 ENCOUNTER — Ambulatory Visit: Payer: Medicare PPO

## 2020-05-24 ENCOUNTER — Ambulatory Visit: Payer: Medicare PPO

## 2020-05-25 ENCOUNTER — Ambulatory Visit: Payer: Medicare PPO

## 2020-05-25 ENCOUNTER — Other Ambulatory Visit: Payer: Self-pay | Admitting: Student

## 2020-05-25 NOTE — Progress Notes (Signed)
Hematology/Oncology Consult note [facility]  Telephone:([phone] [phone]  Patient Care Team: Baxter Hire, MD as PCP - General (Internal Medicine) Telford Nab, RN as Oncology Nurse Navigator   Name of the patient: Kylie Washington  [id number]  1940-12-01   Date of visit: 05/25/20  Diagnosis-lung mass involving the spine.  Etiology unclear  Chief complaint/ Reason for visit-discuss biopsy results and further management  Heme/Onc history: Patient is a 80 year old female with a past medical history significant for psoriatic arthritis and hypothyroidism.  She presented with symptoms of Mid back pain which prompted x-rays which led to CT scan.  CT chest without contrast showed subpleural mass in the posterior medial left upper lobe measuring 5.4 x 3.2 x 7.9 cm.  Tumor extends into left T2-T3, T3-T4 and T4-T5 neural foramina.  Mass-effect upon the cord.  Several adjacent satellite nodules identified in the paravertebral left upper lobe.  Bilateral low-attenuation adrenal nodules.  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  Patient is currently on tramadol and uses mainly ibuprofen for her back pain.  She was also prescribed Percocet by Dr. Jefm Bryant but she has not started using it.  Interval history-patient reports some back pain but has improved overall.  Denies any new complaints at this time  ECOG PS- 1 Pain scale- 4 Opioid associated constipation- no  Review of systems- Review of Systems  Constitutional: Negative for chills, fever, malaise/fatigue and weight loss.  HENT: Negative for congestion, ear discharge and nosebleeds.   Eyes: Negative for blurred vision.  Respiratory: Negative for cough, hemoptysis, sputum production, shortness of breath and wheezing.   Cardiovascular: Negative for chest pain, palpitations, orthopnea and claudication.  Gastrointestinal: Negative for abdominal pain, blood in stool, constipation,  diarrhea, heartburn, melena, nausea and vomiting.  Genitourinary: Negative for dysuria, flank pain, frequency, hematuria and urgency.  Musculoskeletal: Positive for back pain. Negative for joint pain and myalgias.  Skin: Negative for rash.  Neurological: Negative for dizziness, tingling, focal weakness, seizures, weakness and headaches.  Endo/Heme/Allergies: Does not bruise/bleed easily.  Psychiatric/Behavioral: Negative for depression and suicidal ideas. The patient does not have insomnia.       Allergies  Allergen Reactions  . Aurothioglucose Other (See Comments)    Granulocytopenia  . Codeine Nausea Only and Nausea And Vomiting  . Methotrexate Derivatives     Liver enlargement  . Penicillins     Fainting   . Sulfa Antibiotics Nausea Only     Past Medical History:  Diagnosis Date  . 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 Coronary artery disease   . Hypercholesteremia   . Hypothyroidism   . Psoriasis   . Psoriatic arthritis [facility])      Past Surgical History:  Procedure Laterality Date  . CARPAL TUNNEL RELEASE  1988  . CATARACT EXTRACTION, BILATERAL  1997  . CHOLECYSTECTOMY    . colonopscopy  2018  . COLONOSCOPY  2008  . LIVER BIOPSY  1986  . STRABISMUS SURGERY Left 1997  . TONSILLECTOMY  1945    Social History   Socioeconomic History  . Marital status: Married    Spouse name: [name]   . Number of children: 2  . Years of education: Not on file  . Highest education level: Not on file  Occupational History  . Occupation: retired     Comment: [employer]   Tobacco Use  . Smoking status: Current Some Day Smoker    Years: 49.00    Types: Cigarettes  .  Smokeless tobacco: Never Used  Substance and Sexual Activity  . Alcohol use: Not Currently  . Drug use: Never  . Sexual activity: Not on file  Other Topics Concern  . Not on file  Social History Narrative   Lives at home with spouse 55 years   Social Determinants of Health   Financial Resource Strain:  Not on file  Food Insecurity: Not on file  Transportation Needs: Not on file  Physical Activity: Not on file  Stress: Not on file  Social Connections: Not on file  Intimate Partner Violence: Not on file    No family history on file.   Current Outpatient Medications:  .  Artificial Tear Solution (SOOTHE HYDRATION) 1.25 % SOLN, Apply to eye., Disp: , Rfl:  .  dexamethasone (DECADRON) 4 MG tablet, Take 1 tablet (4 mg total) by mouth 2 (two) times daily., Disp: 60 tablet, Rfl: 1 .  fluocinonide gel (LIDEX) 0.05 %, Apply topically., Disp: , Rfl:  .  HYDROcodone-acetaminophen (NORCO/VICODIN) 5-325 MG tablet, Take 1 tablet by mouth every 6 (six) hours as needed for moderate pain., Disp: 30 tablet, Rfl: 0 .  ibuprofen (ADVIL) 400 MG tablet, Take 400 mg by mouth every 6 (six) hours as needed., Disp: , Rfl:  .  levothyroxine (SYNTHROID) 88 MCG tablet, TAKE 1 TABLET BY MOUTH ONCE DAILY ON AN EMPTY STOMACH WITH A GLASS OF WATER AT LEAST 30-60 MINUTES BEFORE BREAKFAST, Disp: , Rfl:  .  LORazepam (ATIVAN) 0.5 MG tablet, Take 1 tablet by mouth prior to MRI scan, Disp: 1 tablet, Rfl: 0 .  pantoprazole (PROTONIX) 20 MG tablet, Take 1 tablet (20 mg total) by mouth daily., Disp: 30 tablet, Rfl: 1  Physical exam:  Vitals:   05/18/20 1328  BP: 122/72  Pulse: 63  Resp: 20  Temp: 98.4 F (36.9 C)  TempSrc: Tympanic  SpO2: 98%  Weight: 149 lb 14.4 oz (68 kg)   Physical Exam Constitutional:      General: She is not in acute distress. Eyes:     Extraocular Movements: EOM normal.  Cardiovascular:     Rate and Rhythm: Normal rate and regular rhythm.     Heart sounds: Normal heart sounds.  Pulmonary:     Effort: Pulmonary effort is normal.     Breath sounds: Normal breath sounds.  Abdominal:     General: Bowel sounds are normal.     Palpations: Abdomen is soft.  Skin:    General: Skin is warm and dry.  Neurological:     Mental Status: She is alert and oriented to person, place, and time.       CMP Latest Ref Rng & Units 05/03/2020  Glucose 70 - 99 mg/dL 111(H)  BUN 8 - 23 mg/dL 15  Creatinine 0.44 - 1.00 mg/dL 1.12(H)  Sodium 135 - 145 mmol/L 139  Potassium 3.5 - 5.1 mmol/L 4.4  Chloride 98 - 111 mmol/L 102  CO2 22 - 32 mmol/L 27  Calcium 8.9 - 10.3 mg/dL 9.0  Total Protein 6.5 - 8.1 g/dL 8.0  Total Bilirubin 0.3 - 1.2 mg/dL 0.5  Alkaline Phos 38 - 126 U/L 92  AST 15 - 41 U/L 14(L)  ALT 0 - 44 U/L 11   CBC Latest Ref Rng & Units 05/12/2020  WBC 4.0 - 10.5 K/uL 15.3(H)  Hemoglobin 12.0 - 15.0 g/dL 12.1  Hematocrit 36.0 - 46.0 % 38.3  Platelets 150 - 400 K/uL 271    No images are attached to the encounter.  MR Brain W Wo Contrast  Result Date: 05/09/2020 CLINICAL DATA:  Metastatic workup.  Lung mass. EXAM: MRI HEAD WITHOUT AND WITH CONTRAST TECHNIQUE: Multiplanar, multiecho pulse sequences of the brain and surrounding structures were obtained without and with intravenous contrast. CONTRAST:  68mL GADAVIST GADOBUTROL 1 MMOL/ML IV SOLN COMPARISON:  None. FINDINGS: Brain: No enhancement or swelling to suggest metastatic disease. No incidental infarct, hemorrhage, hydrocephalus, or collection. Age normal brain volume. Mild to moderate for age chronic small vessel ischemia in the deep cerebral white matter. Vascular: Normal flow voids and vascular enhancements. Skull and upper cervical spine: No noted metastatic disease. Degenerative facet spurring. Sinuses/Orbits: Symmetric prominent extraocular muscle thickness without discrete mass. The lateral rectus and obliques appear spared. No proptosis. Bilateral cataract resection. IMPRESSION: 1. No evidence of metastatic disease to the brain. 2. 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 Electronically Signed   By: Monte Fantasia M.D.   On: 05/09/2020 10:43   MR THORACIC SPINE W WO CONTRAST  Result Date: 05/09/2020 CLINICAL DATA:  Evaluate possible lung mass involving the thoracic spine. EXAM: MRI THORACIC  WITHOUT AND WITH CONTRAST TECHNIQUE: Multiplanar and multiecho pulse sequences of the thoracic spine were obtained without and with intravenous contrast. CONTRAST:  105mL GADAVIST GADOBUTROL 1 MMOL/ML IV SOLN COMPARISON:  Head CT from 3 days ago FINDINGS: Alignment:  Normal thoracic alignment. Vertebrae: There is altered marrow signal and probable infiltration of the lateral left vertebral bodies of T2-T4. The associated medial ribs also show altered signal and probable involvement. There is definite infiltration into the left T2-3, T3-4, and T4-5 foramina, greatest at the level of T3-4 where there is epidural tumor displacing the thecal sac to the right. No evidence of hematogenous osseous spread. No cord edema. Cord: Thecal sac mass effect as noted above. No evidence of intramedullary metastasis. Paraspinal and other soft tissues: Left paraspinal mass with epicenter along the upper lobe/pleura as seen on prior PET-CT. Disc levels: Ordinary degenerative changes.  No degenerative impingement. IMPRESSION: The left chest mass infiltrates the T2-3, T3-4, and T4-5 left foramina with left-sided epidural tumor at T3-4 causing mass effect on the thecal sac. The left-sided T2-T4 vertebrae and medial ribs are likely also infiltrated. Electronically Signed   By: Monte Fantasia M.D.   On: 05/09/2020 10:41   NM PET Image Initial (PI) Skull Base To Thigh  Result Date: 05/06/2020 CLINICAL DATA:  Initial treatment strategy for lung mass. EXAM: NUCLEAR MEDICINE PET SKULL BASE TO THIGH TECHNIQUE: 7.4 mCi F-18 FDG was injected intravenously. Full-ring PET imaging was performed from the skull base to thigh after the radiotracer. CT data was obtained and used for attenuation correction and anatomic localization. Fasting blood glucose: 98 mg/dl COMPARISON:  Chest CT 04/22/2020 FINDINGS: Mediastinal blood pool activity: SUV max 2.6 Liver activity: SUV max 3.2 NECK: No significant abnormal hypermetabolic activity in this region.  Incidental CT findings: Bilateral common carotid atherosclerotic calcification. CHEST: The primarily pleural-based/paravertebral mass along the left upper lobe extending from the apex down adjacent to the posterior arch is highly hypermetabolic compatible with malignancy, maximum SUV 12.2. Suspected neural foraminal extension and extension into the left side of the spinal canal at the T3-4 level. The AP window lymph node of concern on prior chest CT has a maximum SUV of 3.8. Incidental CT findings: Coronary, aortic arch, and branch vessel atherosclerotic vascular disease. Focal anterior pericardial calcification, unchanged. Small right pleural effusion with adjacent atelectasis. Biapical pleuroparenchymal scarring. ABDOMEN/PELVIS: Located between the broad left transverse process of L5  and the adjacent posterior iliac crest, just anterior to the longissimus thoracis muscle and just above the sacroiliac joint, a 2.5 by 1.5 cm soft tissue density lesion on image 188 of series 3 is present with a maximum SUV of 7.0. Possibilities include metastatic lesion or schwannoma. Incidental CT findings: Right hepatic lobe cyst 1.9 cm in diameter on image 128 series 3. Aortoiliac atherosclerotic vascular disease. Small bilateral adrenal adenomas. Mild sigmoid colon diverticulosis. SKELETON: No significant abnormal hypermetabolic activity in this region. Incidental CT findings: Sclerosis along the SI joints compatible with bilateral sacroiliitis. IMPRESSION: 1. The left upper lobe pleural-based/paravertebral mass has a maximum SUV of 12.2, compatible with malignancy. Suspected extension into the spinal canal at the T3-4 level. Possibilities might include lung cancer, pleural tumor such as mesothelioma, or lymphoma. Mildly hypermetabolic and mildly enlarged AP window lymph node concerning for early spread/involvement. 2. Unusual moderately hypermetabolic lesion just above the left SI joint, maximum SUV 7.0, potentially from  metastatic lesion or schwannoma. 3. Other imaging findings of potential clinical significance: Aortic Atherosclerosis (ICD10-I70.0). Coronary atherosclerosis. Small right pleural effusion. Small bilateral adrenal adenomas. Mild sigmoid colon diverticulosis. Chronic bilateral sacroiliitis. Electronically Signed   By: Van Clines M.D.   On: 05/06/2020 09:42   CT BIOPSY  Result Date: 05/12/2020 INDICATION: No known primary, now with hypermetabolic pleural thickening involving the medial aspect the left upper lobe. Patient presents today for CT-guided biopsy for tissue diagnostic purposes. Given encroachment upon the left T3-T4 neural foramina, patient has been initiated steroid therapy. EXAM: CT-GUIDED BIOPSY OF HYPERMETABOLIC PLEURAL THICKENING INVOLVING THE MEDIAL ASPECT OF THE LEFT UPPER LOBE. COMPARISON:  PET-CT-05/06/2020; chest CT-04/22/2020; 06/22/2011 MEDICATIONS: None. ANESTHESIA/SEDATION: Fentanyl 100 mcg IV; Versed 2 mg IV Sedation time: 12 minutes; The patient was continuously monitored during the procedure by the interventional radiology nurse under my direct supervision. CONTRAST:  None COMPLICATIONS: None immediate. PROCEDURE: Informed consent was obtained from the patient following an explanation of the procedure, risks, benefits and alternatives. The patient understands,agrees and consents for the procedure. All questions were addressed. A time out was performed prior to the initiation of the procedure. The patient was positioned prone on the CT table and a limited chest CT was performed for procedural planning demonstrating interval reduction in size the hypermetabolic pleural thickening with dominant residual nodular component now measuring approximately 1.8 x 1.5 cm (image 12, series 2), previously measuring at least 6.1 x 2.1 cm. Patient was then positioned supine on the CT gantry with limited CT imaging confirming interval reduction in size of the hypermetabolic pleural thickening Above  was discussed with referring oncologist Dr. Janese Banks, and the decision was made to proceed with CT-guided biopsy despite interval reduction in size as it was felt the lesion may represent a lymphoma with interval improvement secondary initiation of steroids. As such, the patient was repositioned prone on the CT table. A limited chest CT was performed for procedural planning purposes. The operative site was prepped and draped in the usual sterile fashion. Under sterile conditions and local anesthesia, a 17 gauge coaxial needle was advanced into the peripheral aspect of the residual hypermetabolic pleural thickening involving medial aspect left upper lobe. Positioning was confirmed with intermittent CT fluoroscopy and followed by the acquisition of 3 core needle biopsies with an 18 gauge core needle biopsy device and placed in saline for analysis. The coaxial needle was removed following deployment of a [employer] plug and superficial hemostasis was achieved with manual compression. Limited post procedural chest CT was negative for pneumothorax  or additional complication. A dressing was placed. The patient tolerated the procedure well without immediate postprocedural complication. The patient was escorted to have an upright chest radiograph. IMPRESSION: 1. Technically successful CT guided core needle core biopsy of residual hypermetabolic pleural thickening involving medial aspect of the left upper lobe. 2. Note, the hypermetabolic pleural thickening has reduced compared to PET-CT performed 05/06/2020 though this may be attributable to response to the initiation of steroids potentially indicative of a lymphoma. Electronically Signed   By: Sandi Mariscal M.D.   On: 05/12/2020 13:37   DG Chest Port 1 View  Result Date: 05/12/2020 CLINICAL DATA:  Post CT-guided biopsy of hypermetabolic pleural thickening involving the medial aspect the left upper lobe. EXAM: PORTABLE CHEST 1 VIEW COMPARISON:  Chest CT-04/22/2020  PET-CT-05/06/2020 CT-guided biopsy of hypermetabolic pleural thickening involving the medial lobe-earlier same day FINDINGS: Borderline enlarged cardiac silhouette and mediastinal contours with atherosclerotic plaque within the thoracic aorta. Known pleural thickening involving the medial aspect of the left upper lobe is suboptimally evaluated on the present examination. Redemonstrated mild diffuse slightly nodular thickening of the pulmonary interstitium. No focal airspace opacities. No pleural effusion or pneumothorax. No evidence of edema. No acute osseous abnormalities. IMPRESSION: No evidence of complication following left upper lobe pulmonary nodule biopsy. Specifically, no evidence of pneumothorax. Electronically Signed   By: Sandi Mariscal M.D.   On: 05/12/2020 13:57     Assessment and plan- Patient is a 79 y.o. female with broad  left upper lobe lung mass with spinal cord involvement here to discuss biopsy results and further management  When patient had seen me on 05/03/2020 she was in significant back pain.  Based on the appearance of the mass on the CT scan lung cancer was high on the differential.  Given the spinal cord involvement I had started her on Decadron.  A week after taking the Decadron when patient went for her biopsy the mass was considerably reduced in size raising the possibility that this could be a lymphoma.  Unfortunately core biopsy was nondiagnostic And showed dense fibrous tissue with mixed inflammatory infiltrate.  Flow cytometry showed no phenotypic evidence of B-cell lymphoma.  Patient is already started 1 fraction of radiation and I discussed her case with radiation oncology to hold off on further radiation until we know what we are dealing with.  Besides the lung mass and spinal cord involvement there was also an uptake in the left sacroiliac joint on PET scan and it is unclear if this is related to the lung mass or separate etiology like a schwannoma.  This is the only  potential targetable lesion at this time and we will proceed with a CT-guided biopsy of the left soft tissue sacroiliac joint mass.  If that is also not diagnostic we may have to consider a VATS procedure to come to a diagnosis.  I have asked the patient to hold off on taking any further steroids at this time.  Follow-up with me to be decided based on second biopsy   Visit Diagnosis 1. Lung mass      Dr. Randa Evens, MD, MPH [facility] [phone] 05/25/2020 8:30 AM

## 2020-05-26 ENCOUNTER — Ambulatory Visit: Payer: Medicare PPO

## 2020-05-26 ENCOUNTER — Other Ambulatory Visit: Payer: Self-pay

## 2020-05-26 ENCOUNTER — Ambulatory Visit
Admission: RE | Admit: 2020-05-26 | Discharge: 2020-05-26 | Disposition: A | Discharge status: Home / Self Care | Payer: Medicare PPO | Source: Ambulatory Visit | Attending: Radiation Oncology | Admitting: Radiation Oncology

## 2020-05-26 DIAGNOSIS — M533 Sacrococcygeal disorders, not elsewhere classified: Secondary | ICD-10-CM | POA: Insufficient documentation

## 2020-05-26 DIAGNOSIS — M799 Soft tissue disorder, unspecified: Secondary | ICD-10-CM | POA: Diagnosis not present

## 2020-05-26 LAB — PROTIME-INR
INR: 1 (ref 0.8–1.2)
Prothrombin Time: 12.5 seconds (ref 11.4–15.2)

## 2020-05-26 LAB — CBC
HCT: 40.2 % (ref 36.0–46.0)
Hemoglobin: 12.7 g/dL (ref 12.0–15.0)
MCH: 27.9 pg (ref 26.0–34.0)
MCHC: 31.6 g/dL (ref 30.0–36.0)
MCV: 88.2 fL (ref 80.0–100.0)
Platelets: 172 10*3/uL (ref 150–400)
RBC: 4.56 MIL/uL (ref 3.87–5.11)
RDW: 15.5 % (ref 11.5–15.5)
WBC: 12.4 10*3/uL — ABNORMAL HIGH (ref 4.0–10.5)
nRBC: 0 % (ref 0.0–0.2)

## 2020-05-26 MED ORDER — FENTANYL CITRATE (PF) 100 MCG/2ML IJ SOLN
INTRAMUSCULAR | Status: AC
  Filled 2020-05-26: qty 2

## 2020-05-26 MED ORDER — SODIUM CHLORIDE 0.9 % IV SOLN: INTRAVENOUS | Status: DC

## 2020-05-26 MED ORDER — MIDAZOLAM HCL 2 MG/2ML IJ SOLN
INTRAMUSCULAR | Status: AC
  Filled 2020-05-26: qty 2

## 2020-05-26 MED ORDER — FENTANYL CITRATE (PF) 100 MCG/2ML IJ SOLN
INTRAMUSCULAR | Status: AC | PRN
  Administered 2020-05-26 (×2): 50 ug via INTRAVENOUS

## 2020-05-26 MED ORDER — MIDAZOLAM HCL 2 MG/2ML IJ SOLN
INTRAMUSCULAR | Status: AC | PRN
  Administered 2020-05-26 (×2): 1 mg via INTRAVENOUS

## 2020-05-26 NOTE — Consult Note (Signed)
Chief Complaint: Patient was seen in consultation today for CT-guided biopsy  Referring Physician(s): Chrystal,Glenn  Patient Status: [facility] - Out-pt  History of Present Illness: Kylie Washington is a 80 y.o. female that recently underwent CT-guided biopsy of a left chest pleural-based lesion. The biopsy did not demonstrate malignancy. Clinically, there is high concern for neoplasm. The pleural-based lesion decreased in size following initial treatment raising possibility of lymphoma. Previous PET/CT demonstrated abnormal uptake along the posterior aspect of the left sacroiliac joint. Patient had significant pain and discomfort following the left chest biopsy. Chest pain has subsequently resolved but she has chronic left shoulder and back pain. Patient denies left hip or pelvic pain. She denies fevers, chills, abdominal pain, urinary problems or bowel problems. She has a new tooth ache in the right lower jaw.  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 Authorizing Provider  dexamethasone (DECADRON) 4 MG tablet Take 1 tablet (4 mg total) by mouth 2 (two) times daily. 05/03/20  Yes Sindy [place], MD  ibuprofen (ADVIL) 400 MG tablet Take 400 mg by mouth every 6 (six) hours as needed.   Yes [provider]  levothyroxine (SYNTHROID) 88 MCG tablet TAKE 1 TABLET BY MOUTH ONCE  DAILY ON AN EMPTY STOMACH WITH A GLASS OF WATER AT LEAST 30-60 MINUTES BEFORE BREAKFAST 02/23/20  Yes [provider]  pantoprazole (PROTONIX) 20 MG tablet Take 1 tablet (20 mg total) by mouth daily. 05/03/20  Yes Sindy [place], MD  Artificial Tear Solution (SOOTHE HYDRATION) 1.25 % SOLN Apply to eye.    [provider]  fluocinonide gel (LIDEX) 0.05 % Apply topically.    [provider]  HYDROcodone-acetaminophen (NORCO/VICODIN) 5-325 MG tablet Take 1 tablet by mouth every 6 (six) hours as needed for moderate pain. Patient not taking: Reported on 05/26/2020 05/13/20   Sindy [place], MD  LORazepam (ATIVAN) 0.5 MG tablet Take 1 tablet by mouth prior to MRI scan 05/04/20   Sindy Guadel[place], MD     History reviewed. No pertinent family history.  Social History   Socioeconomic History   Marital status: Married    Spouse name: John    Number of children: 2   Years of education: Not on file   Highest education level: Not on file  Occupational History   Occupation: retired     Comment: [employer]   Tobacco Use   Smoking status: Current Some Day Smoker    Years: 49.00    Types: Cigarettes   Smokeless tobacco: Never Used   Tobacco comment: 4 or 5 cigs a day  Substance and Sexual Activity   Alcohol use: Not Currently   Drug use: Never   Sexual activity: Not on file  Other Topics Concern   Not on file  Social History Narrative   Lives at  home with spouse 4 years   Social Determinants of Radio broadcast assistant Strain: Not on file  Food Insecurity: Not on file  Transportation Needs: Not on file  Physical Activity: Not on file  Stress: Not on file  Social Connections: Not on file    Review of Systems  Constitutional: Negative.   Cardiovascular:       Back pain. Left shoulder pain.  Gastrointestinal: Negative.   Genitourinary: Negative.   Musculoskeletal: Positive for back pain.    Vital Signs: BP (!) 154/76    Pulse 88    Temp  98.4 F (36.9 C) (Oral)    Resp (!) 21    Ht 5' 6.5" (1.689 m)    Wt 68 kg    SpO2 98%    BMI 23.85 kg/m   Physical Exam Constitutional:      Appearance: Normal appearance. She is not ill-appearing.  HENT:     Mouth/Throat:     Comments: No palpable abnormality in the right mandibular region. Cardiovascular:     Rate and Rhythm: Normal rate and regular rhythm.     Heart sounds: Normal heart sounds.  Pulmonary:     Effort: Pulmonary effort is normal.     Breath sounds: Normal breath sounds.  Abdominal:     General: Abdomen is flat. Bowel sounds are normal.     Palpations: Abdomen is soft.  Neurological:     Mental Status: She is alert.     Imaging: MR Brain W Wo Contrast  Result Date: 05/09/2020 CLINICAL DATA:  Metastatic workup.  Lung mass. EXAM: MRI HEAD WITHOUT AND WITH CONTRAST TECHNIQUE: Multiplanar, multiecho pulse sequences of the brain and surrounding structures were obtained without and with intravenous contrast. CONTRAST:  42mL GADAVIST GADOBUTROL 1 MMOL/ML IV SOLN COMPARISON:  None. FINDINGS: Brain: No enhancement or swelling to suggest metastatic disease. No incidental infarct, hemorrhage, hydrocephalus, or collection. Age normal brain volume. Mild to moderate for age chronic small vessel ischemia in the deep cerebral white matter. Vascular: Normal flow voids and vascular enhancements. Skull and upper cervical spine: No noted metastatic disease. Degenerative facet spurring. Sinuses/Orbits: Symmetric prominent extraocular muscle thickness without discrete mass. The lateral rectus and obliques appear spared. No proptosis. Bilateral cataract resection. IMPRESSION: 1. No evidence of metastatic disease to the brain. 2. 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 Electronically Signed   By: Monte Fantasia M.D.   On: 05/09/2020 10:43   MR THORACIC SPINE W WO CONTRAST  Result Date: 05/09/2020 CLINICAL DATA:  Evaluate possible lung mass involving the  thoracic spine. EXAM: MRI THORACIC WITHOUT AND WITH CONTRAST TECHNIQUE: Multiplanar and multiecho pulse sequences of the thoracic spine were obtained without and with intravenous contrast. CONTRAST:  12mL GADAVIST GADOBUTROL 1 MMOL/ML IV SOLN COMPARISON:  Head CT from 3 days ago FINDINGS: Alignment:  Normal thoracic alignment. Vertebrae: There is altered marrow signal and probable infiltration of the lateral left vertebral bodies of T2-T4. The associated medial ribs also show altered signal and probable involvement. There is definite infiltration into the left T2-3, T3-4, and T4-5 foramina, greatest at the level of T3-4 where there is epidural tumor displacing the thecal sac to the right. No evidence of hematogenous osseous spread. No cord edema. Cord: Thecal sac mass effect as noted above. No evidence of intramedullary metastasis. Paraspinal and other soft tissues: Left paraspinal mass with epicenter along the upper lobe/pleura as seen on prior PET-CT. Disc levels: Ordinary degenerative changes.  No degenerative impingement. IMPRESSION: The left  chest mass infiltrates the T2-3, T3-4, and T4-5 left foramina with left-sided epidural tumor at T3-4 causing mass effect on the thecal sac. The left-sided T2-T4 vertebrae and medial ribs are likely also infiltrated. Electronically Signed   By: Monte Fantasia M.D.   On: 05/09/2020 10:41   NM PET Image Initial (PI) Skull Base To Thigh  Result Date: 05/06/2020 CLINICAL DATA:  Initial treatment strategy for lung mass. EXAM: NUCLEAR MEDICINE PET SKULL BASE TO THIGH TECHNIQUE: 7.4 mCi F-18 FDG was injected intravenously. Full-ring PET imaging was performed from the skull base to thigh after the radiotracer. CT data was obtained and used for attenuation correction and anatomic localization. Fasting blood glucose: 98 mg/dl COMPARISON:  Chest CT 04/22/2020 FINDINGS: Mediastinal blood pool activity: SUV max 2.6 Liver activity: SUV max 3.2 NECK: No significant abnormal  hypermetabolic activity in this region. Incidental CT findings: Bilateral common carotid atherosclerotic calcification. CHEST: The primarily pleural-based/paravertebral mass along the left upper lobe extending from the apex down adjacent to the posterior arch is highly hypermetabolic compatible with malignancy, maximum SUV 12.2. Suspected neural foraminal extension and extension into the left side of the spinal canal at the T3-4 level. The AP window lymph node of concern on prior chest CT has a maximum SUV of 3.8. Incidental CT findings: Coronary, aortic arch, and branch vessel atherosclerotic vascular disease. Focal anterior pericardial calcification, unchanged. Small right pleural effusion with adjacent atelectasis. Biapical pleuroparenchymal scarring. ABDOMEN/PELVIS: Located between the broad left transverse process of L5 and the adjacent posterior iliac crest, just anterior to the longissimus thoracis muscle and just above the sacroiliac joint, a 2.5 by 1.5 cm soft tissue density lesion on image 188 of series 3 is present with a maximum SUV of 7.0. Possibilities include metastatic lesion or schwannoma. Incidental CT findings: Right hepatic lobe cyst 1.9 cm in diameter on image 128 series 3. Aortoiliac atherosclerotic vascular disease. Small bilateral adrenal adenomas. Mild sigmoid colon diverticulosis. SKELETON: No significant abnormal hypermetabolic activity in this region. Incidental CT findings: Sclerosis along the SI joints compatible with bilateral sacroiliitis. IMPRESSION: 1. The left upper lobe pleural-based/paravertebral mass has a maximum SUV of 12.2, compatible with malignancy. Suspected extension into the spinal canal at the T3-4 level. Possibilities might include lung cancer, pleural tumor such as mesothelioma, or lymphoma. Mildly hypermetabolic and mildly enlarged AP window lymph node concerning for early spread/involvement. 2. Unusual moderately hypermetabolic lesion just above the left SI joint,  maximum SUV 7.0, potentially from metastatic lesion or schwannoma. 3. Other imaging findings of potential clinical significance: Aortic Atherosclerosis (ICD10-I70.0). Coronary atherosclerosis. Small right pleural effusion. Small bilateral adrenal adenomas. Mild sigmoid colon diverticulosis. Chronic bilateral sacroiliitis. Electronically Signed   By: Van Clines M.D.   On: 05/06/2020 09:42   CT BIOPSY  Result Date: 05/12/2020 INDICATION: No known primary, now with hypermetabolic pleural thickening involving the medial aspect the left upper lobe. Patient presents today for CT-guided biopsy for tissue diagnostic purposes. Given encroachment upon the left T3-T4 neural foramina, patient has been initiated steroid therapy. EXAM: CT-GUIDED BIOPSY OF HYPERMETABOLIC PLEURAL THICKENING INVOLVING THE MEDIAL ASPECT OF THE LEFT UPPER LOBE. COMPARISON:  PET-CT-05/06/2020; chest CT-04/22/2020; 06/22/2011 MEDICATIONS: None. ANESTHESIA/SEDATION: Fentanyl 100 mcg IV; Versed 2 mg IV Sedation time: 12 minutes; The patient was continuously monitored during the procedure by the interventional radiology nurse under my direct supervision. CONTRAST:  None COMPLICATIONS: None immediate. PROCEDURE: Informed consent was obtained from the patient following an explanation of the procedure, risks, benefits and alternatives. The patient understands,agrees and consents  for the procedure. All questions were addressed. A time out was performed prior to the initiation of the procedure. The patient was positioned prone on the CT table and a limited chest CT was performed for procedural planning demonstrating interval reduction in size the hypermetabolic pleural thickening with dominant residual nodular component now measuring approximately 1.8 x 1.5 cm (image 12, series 2), previously measuring at least 6.1 x 2.1 cm. Patient was then positioned supine on the CT gantry with limited CT imaging confirming interval reduction in size of the  hypermetabolic pleural thickening Above was discussed with referring oncologist Dr. Janese Banks, and the decision was made to proceed with CT-guided biopsy despite interval reduction in size as it was felt the lesion may represent a lymphoma with interval improvement secondary initiation of steroids. As such, the patient was repositioned prone on the CT table. A limited chest CT was performed for procedural planning purposes. The operative site was prepped and draped in the usual sterile fashion. Under sterile conditions and local anesthesia, a 17 gauge coaxial needle was advanced into the peripheral aspect of the residual hypermetabolic pleural thickening involving medial aspect left upper lobe. Positioning was confirmed with intermittent CT fluoroscopy and followed by the acquisition of 3 core needle biopsies with an 18 gauge core needle biopsy device and placed in saline for analysis. The coaxial needle was removed following deployment of a Biosentry plug and superficial hemostasis was achieved with manual compression. Limited post procedural chest CT was negative for pneumothorax or additional complication. A dressing was placed. The patient tolerated the procedure well without immediate postprocedural complication. The patient was escorted to have an upright chest radiograph. IMPRESSION: 1. Technically successful CT guided core needle core biopsy of residual hypermetabolic pleural thickening involving medial aspect of the left upper lobe. 2. Note, the hypermetabolic pleural thickening has reduced compared to PET-CT performed 05/06/2020 though this may be attributable to response to the initiation of steroids potentially indicative of a lymphoma. Electronically Signed   By: Sandi Mariscal M.D.   On: 05/12/2020 13:37   DG Chest Port 1 View  Result Date: 05/12/2020 CLINICAL DATA:  Post CT-guided biopsy of hypermetabolic pleural thickening involving the medial aspect the left upper lobe. EXAM: PORTABLE CHEST 1 VIEW  COMPARISON:  Chest CT-04/22/2020 PET-CT-05/06/2020 CT-guided biopsy of hypermetabolic pleural thickening involving the medial lobe-earlier same day FINDINGS: Borderline enlarged cardiac silhouette and mediastinal contours with atherosclerotic plaque within the thoracic aorta. Known pleural thickening involving the medial aspect of the left upper lobe is suboptimally evaluated on the present examination. Redemonstrated mild diffuse slightly nodular thickening of the pulmonary interstitium. No focal airspace opacities. No pleural effusion or pneumothorax. No evidence of edema. No acute osseous abnormalities. IMPRESSION: No evidence of complication following left upper lobe pulmonary nodule biopsy. Specifically, no evidence of pneumothorax. Electronically Signed   By: Sandi Mariscal M.D.   On: 05/12/2020 13:57    Labs:  CBC: Recent Labs    05/03/20 1443 05/12/20 0932 05/26/20 0940  WBC 12.2* 15.3* 12.4*  HGB 12.3 12.1 12.7  HCT 38.5 38.3 40.2  PLT 181 271 172    COAGS: Recent Labs    05/12/20 0932 05/26/20 0940  INR 1.1 1.0    BMP: Recent Labs    05/03/20 1443  NA 139  K 4.4  CL 102  CO2 27  GLUCOSE 111*  BUN 15  CALCIUM 9.0  CREATININE 1.12*  GFRNONAA 50*    LIVER FUNCTION TESTS: Recent Labs    05/03/20 1443  BILITOT 0.5  AST 14*  ALT 11  ALKPHOS 92  PROT 8.0  ALBUMIN 3.6    TUMOR MARKERS: No results for input(s): AFPTM, CEA, CA199, CHROMGRNA in the last 8760 hours.  Assessment and Plan:  80 year old with pleural-based mass in the left chest with extension into thoracic neural foramen. Previous CT-guided biopsy did not demonstrate malignancy but there is high clinical concern for a neoplasm. PET/CT also demonstrated an area of abnormal uptake posterior to the left SI joint. Plan for CT-guided biopsy of this tissue posterior to the left SI joint.  Risks and benefits of CT-guided biopsy was discussed with the patient and/or patient's family including, but not  limited to bleeding, infection, damage to adjacent structures or low yield requiring additional tests.  All of the questions were answered and there is agreement to proceed.  Consent signed and in chart.   Electronically Signed: Burman Riis, MD 05/26/2020, 10:24 AM   I spent a total of   10 Minutes in face to face in clinical consultation, greater than 50% of which was counseling/coordinating care for CT-guided biopsy.

## 2020-05-26 NOTE — Procedures (Signed)
Interventional Radiology Procedure:   Indications: Left chest mass and left pelvic soft tissue lesion, needs tissue diagnosis.  Chest biopsy was inconclusive.  Procedure: CT guided core biopsy of left posterior pelvic lesion  Findings: Cores obtained from tissue posterior to left SI joint.  Specimens placed on Telfa pad with saline.   Complications: None     EBL: Minimal, less than 10 ml  Plan: Bedrest 1 hour.   Samyukta Cura R. Anselm Pancoast, MD  Pager: [phone]

## 2020-05-26 NOTE — Discharge Instructions (Signed)
Needle Biopsy of the Bone, Care After This sheet gives you information about how to care for yourself after your procedure. Your health care provider may also give you more specific instructions. If you have problems or questions, contact your health care provider. What can I expect after the procedure? After the procedure, it is common to have soreness or tenderness at the puncture site. Follow these instructions at home: Puncture care  Follow instructions from your health care provider about how to take care of your puncture site. Make sure you: ? Wash your hands with soap and water before and after you change your bandage (dressing). If soap and water are not available, use hand sanitizer. ? Change your dressing as told by your health care provider.  Check your puncture site every day for signs of infection. Check for: ? Redness, swelling, or worsening pain. ? Fluid or blood. ? Warmth. ? Pus or a bad smell.   General instructions  Take over-the-counter and prescription medicines only as told by your health care provider.  Do not drive for 24 hours if you were given a sedative during your procedure.  Return to your normal activities as told by your health care provider.  Keep all follow-up visits as told by your health care provider. This is important. Contact a health care provider if:  You have redness, swelling, or worsening pain at the site of your puncture.  You have fluid or blood coming from your puncture site.  Your puncture site feels warm to the touch.  You have pus or a bad smell coming from your puncture site.  You have a fever.  You have persistent nausea or vomiting. Get help right away if:  You develop a rash.  You have difficulty breathing. Summary  After the procedure, it is common to have soreness or tenderness at the puncture site.  Follow instructions from your health care provider about how to take care of your puncture site.  Contact a health  care provider if you have any signs of infection.  Keep all follow-up visits as told by your health care provider. This is important. This information is not intended to replace advice given to you by your health care provider. Make sure you discuss any questions you have with your health care provider. Document Revised: 12/06/2017 Document Reviewed: 12/06/2017 Elsevier Patient Education  2021 [employer].

## 2020-05-27 ENCOUNTER — Ambulatory Visit: Payer: Medicare PPO

## 2020-05-27 ENCOUNTER — Encounter: Payer: Self-pay | Admitting: Oncology

## 2020-05-28 ENCOUNTER — Encounter: Payer: Self-pay | Admitting: Oncology

## 2020-05-28 ENCOUNTER — Ambulatory Visit: Payer: Medicare PPO

## 2020-05-31 ENCOUNTER — Ambulatory Visit: Payer: Medicare PPO

## 2020-05-31 DIAGNOSIS — K047 Periapical abscess without sinus: Secondary | ICD-10-CM

## 2020-05-31 HISTORY — DX: Periapical abscess without sinus: K04.7

## 2020-06-01 ENCOUNTER — Inpatient Hospital Stay: Payer: Medicare PPO | Admitting: Oncology

## 2020-06-01 ENCOUNTER — Ambulatory Visit: Payer: Medicare PPO

## 2020-06-01 ENCOUNTER — Telehealth: Payer: Self-pay | Admitting: *Deleted

## 2020-06-01 NOTE — Telephone Encounter (Signed)
Pt made aware of follow up appt rescheduled to Fri 2/25 at 2pm in addition to radiation treatment rescheduled to start on Mon 2/28 at 3pm tentatively. During call, pt states recently had tooth abscess that required extraction. Pt currently taking amoxicillin prescribed by dentist. States that has some back pain that is being managed with ibuprofen at this time. Instructed pt to continue to monitor and if pain worsens or notices symptoms of leg weakness to call back.   Pt verbalized understanding.

## 2020-06-02 ENCOUNTER — Ambulatory Visit: Payer: Medicare PPO

## 2020-06-03 ENCOUNTER — Ambulatory Visit: Payer: Medicare PPO

## 2020-06-03 ENCOUNTER — Encounter: Payer: Self-pay | Admitting: Oncology

## 2020-06-04 ENCOUNTER — Other Ambulatory Visit: Payer: Self-pay

## 2020-06-04 ENCOUNTER — Telehealth: Payer: Self-pay | Admitting: *Deleted

## 2020-06-04 ENCOUNTER — Ambulatory Visit: Payer: Medicare PPO

## 2020-06-04 ENCOUNTER — Inpatient Hospital Stay (HOSPITAL_BASED_OUTPATIENT_CLINIC_OR_DEPARTMENT_OTHER): Payer: Medicare PPO | Admitting: Oncology

## 2020-06-04 ENCOUNTER — Encounter: Payer: Self-pay | Admitting: Oncology

## 2020-06-04 DIAGNOSIS — R918 Other nonspecific abnormal finding of lung field: Secondary | ICD-10-CM | POA: Diagnosis not present

## 2020-06-04 NOTE — Telephone Encounter (Signed)
Called pt to let her know that at this time we do not have the pathology report  From biopsy and if we don't have results yet. We have checked this am and we will check again at 1:30 and if it is not back we will need to r/s the appt. Pt agreeable to the plan

## 2020-06-04 NOTE — Progress Notes (Signed)
Discuss the pathology  results

## 2020-06-04 NOTE — Progress Notes (Signed)
I connected with Kylie Washington on 06/04/20 at  2:00 PM EST by video enabled telemedicine visit and verified that I am speaking with the correct person using two identifiers.   I discussed the limitations, risks, security and privacy concerns of performing an evaluation and management service by telemedicine and the availability of in-person appointments. I also discussed with the patient that there may be a patient responsible charge related to this service. The patient expressed understanding and agreed to proceed.  Other persons participating in the visit and their role in the encounter:  none  Patient's location:  home Provider's location:  work  Risk analyst Complaint: Discuss pathology results and further management  History of present illness: Patient is a 80 year old female with a past medical history significant for psoriatic arthritis and hypothyroidism. She presented with symptoms of Mid back pain which prompted x-rays which led to CT scan. CT chest without contrast showed subpleural mass in the posterior medial left upper lobe measuring 5.4 x 3.2 x 7.9 cm. Tumor extends into left T2-T3, T3-T4 and T4-T5 neural foramina. Mass-effect upon the cord. Several adjacent satellite nodules identified in the paravertebral left upper lobe. Bilateral low-attenuation adrenal nodules. 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 Patient is currently on tramadol and uses mainly ibuprofen for her back pain.  Given the significant back pain and concern for spinal cord involvement patient was referred to radiation oncology and started on steroids.  A week after starting steroids when patient went for a biopsy left upper lobe subpleural mass appeared significantly smaller Measuring 1.8 x 1.5 cm as compared to 6.1 x 2.1 cm prior.  Core biopsy was performed but was not diagnostic of malignancy and showed dense fibrous tissue with mixed inflammatory infiltrate.  Given that the PET CT scan also  showed area of hypermetabolism in the soft tissue density adjacent to PSIS a second biopsy of that region was attempted but was again nondiagnostic of malignancy.  While awaiting biopsy results patient had received 2 or 3 radiation treatments given that the mass was involving spinal cord at T3-T4 level.   Interval history patient continues to endorse some back pain for which she is using as needed ibuprofen.  She is not taking any steroids presently.  Denies other complaints at this time   Review of Systems  Constitutional: Negative for chills, fever, malaise/fatigue and weight loss.  HENT: Negative for congestion, ear discharge and nosebleeds.   Eyes: Negative for blurred vision.  Respiratory: Negative for cough, hemoptysis, sputum production, shortness of breath and wheezing.   Cardiovascular: Negative for chest pain, palpitations, orthopnea and claudication.  Gastrointestinal: Negative for abdominal pain, blood in stool, constipation, diarrhea, heartburn, melena, nausea and vomiting.  Genitourinary: Negative for dysuria, flank pain, frequency, hematuria and urgency.  Musculoskeletal: Positive for back pain. Negative for joint pain and myalgias.  Skin: Negative for rash.  Neurological: Negative for dizziness, tingling, focal weakness, seizures, weakness and headaches.  Endo/Heme/Allergies: Does not bruise/bleed easily.  Psychiatric/Behavioral: Negative for depression and suicidal ideas. The patient does not have insomnia.     Allergies  Allergen Reactions  . Aurothioglucose Other (See Comments)    Granulocytopenia  . Codeine Nausea Only and Nausea And Vomiting  . Methotrexate Derivatives     Liver enlargement  . Sulfa Antibiotics Nausea Only    Past Medical History:  Diagnosis Date  . 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 Coronary artery disease   . Hypercholesteremia   . Hypothyroidism   . Psoriasis   .  Psoriatic arthritis [facility])     Past Surgical History:  Procedure  Laterality Date  . CARPAL TUNNEL RELEASE  1988  . CATARACT EXTRACTION, BILATERAL  1997  . CHOLECYSTECTOMY    . colonopscopy  2018  . COLONOSCOPY  2008  . LIVER BIOPSY  1986  . STRABISMUS SURGERY Left 1997  . TONSILLECTOMY  1945    Social History   Socioeconomic History  . Marital status: Married    Spouse name: Kylie Washington   . Number of children: 2  . Years of education: Not on file  . Highest education level: Not on file  Occupational History  . Occupation: retired     Comment: [employer]   Tobacco Use  . Smoking status: Current Some Day Smoker    Packs/day: 0.25    Years: 49.00    Pack years: 12.25    Types: Cigarettes  . Smokeless tobacco: Never Used  . Tobacco comment: 4 or 5 cigs a day  Vaping Use  . Vaping Use: Former  Substance and Sexual Activity  . Alcohol use: Not Currently  . Drug use: Never  . Sexual activity: Yes  Other Topics Concern  . Not on file  Social History Narrative   Lives at home with spouse 12 years   Social Determinants of Health   Financial Resource Strain: Not on file  Food Insecurity: Not on file  Transportation Needs: Not on file  Physical Activity: Not on file  Stress: Not on file  Social Connections: Not on file  Intimate Partner Violence: Not on file    Family History  Problem Relation Age of Onset  . Diabetes Mother   . Diabetes Sister   . Cancer Maternal Uncle   . Cancer Paternal Uncle      Current Outpatient Medications:  .  Artificial Tear Solution (SOOTHE HYDRATION) 1.25 % SOLN, Apply to eye., Disp: , Rfl:  .  fluocinonide gel (LIDEX) 1.69 %, Apply 1 application topically as needed., Disp: , Rfl:  .  ibuprofen (ADVIL) 400 MG tablet, Take 400 mg by mouth every 6 (six) hours as needed., Disp: , Rfl:  .  levothyroxine (SYNTHROID) 88 MCG tablet, TAKE 1 TABLET BY MOUTH ONCE DAILY ON AN EMPTY STOMACH WITH A GLASS OF WATER AT LEAST 30-60 MINUTES BEFORE BREAKFAST, Disp: , Rfl:  .  dexamethasone (DECADRON) 4 MG tablet, Take  1 tablet (4 mg total) by mouth 2 (two) times daily. (Patient not taking: Reported on 06/04/2020), Disp: 60 tablet, Rfl: 1 .  pantoprazole (PROTONIX) 20 MG tablet, Take 1 tablet (20 mg total) by mouth daily. (Patient not taking: Reported on 06/04/2020), Disp: 30 tablet, Rfl: 1  MR Brain W Wo Contrast  Result Date: 05/09/2020 CLINICAL DATA:  Metastatic workup.  Lung mass. EXAM: MRI HEAD WITHOUT AND WITH CONTRAST TECHNIQUE: Multiplanar, multiecho pulse sequences of the brain and surrounding structures were obtained without and with intravenous contrast. CONTRAST:  50mL GADAVIST GADOBUTROL 1 MMOL/ML IV SOLN COMPARISON:  None. FINDINGS: Brain: No enhancement or swelling to suggest metastatic disease. No incidental infarct, hemorrhage, hydrocephalus, or collection. Age normal brain volume. Mild to moderate for age chronic small vessel ischemia in the deep cerebral white matter. Vascular: Normal flow voids and vascular enhancements. Skull and upper cervical spine: No noted metastatic disease. Degenerative facet spurring. Sinuses/Orbits: Symmetric prominent extraocular muscle thickness without discrete mass. The lateral rectus and obliques appear spared. No proptosis. Bilateral cataract resection. IMPRESSION: 1. No evidence of metastatic disease to the brain. 2. 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 Electronically Signed   By: Monte Fantasia M.D.   On: 05/09/2020 10:43   MR THORACIC SPINE W WO CONTRAST  Result Date: 05/09/2020 CLINICAL DATA:  Evaluate possible lung mass involving the thoracic spine. EXAM: MRI THORACIC WITHOUT AND WITH CONTRAST TECHNIQUE: Multiplanar and multiecho pulse sequences of the thoracic spine were obtained without and with intravenous contrast. CONTRAST:  42mL GADAVIST GADOBUTROL 1 MMOL/ML IV SOLN COMPARISON:  Head CT from 3 days ago FINDINGS: Alignment:  Normal thoracic alignment. Vertebrae: There is altered marrow signal and probable infiltration of the  lateral left vertebral bodies of T2-T4. The associated medial ribs also show altered signal and probable involvement. There is definite infiltration into the left T2-3, T3-4, and T4-5 foramina, greatest at the level of T3-4 where there is epidural tumor displacing the thecal sac to the right. No evidence of hematogenous osseous spread. No cord edema. Cord: Thecal sac mass effect as noted above. No evidence of intramedullary metastasis. Paraspinal and other soft tissues: Left paraspinal mass with epicenter along the upper lobe/pleura as seen on prior PET-CT. Disc levels: Ordinary degenerative changes.  No degenerative impingement. IMPRESSION: The left chest mass infiltrates the T2-3, T3-4, and T4-5 left foramina with left-sided epidural tumor at T3-4 causing mass effect on the thecal sac. The left-sided T2-T4 vertebrae and medial ribs are likely also infiltrated. Electronically Signed   By: Monte Fantasia M.D.   On: 05/09/2020 10:41   NM PET Image Initial (PI) Skull Base To Thigh  Result Date: 05/06/2020 CLINICAL DATA:  Initial treatment strategy for lung mass. EXAM: NUCLEAR MEDICINE PET SKULL BASE TO THIGH TECHNIQUE: 7.4 mCi F-18 FDG was injected intravenously. Full-ring PET imaging was performed from the skull base to thigh after the radiotracer. CT data was obtained and used for attenuation correction and anatomic localization. Fasting blood glucose: 98 mg/dl COMPARISON:  Chest CT 04/22/2020 FINDINGS: Mediastinal blood pool activity: SUV max 2.6 Liver activity: SUV max 3.2 NECK: No significant abnormal hypermetabolic activity in this region. Incidental CT findings: Bilateral common carotid atherosclerotic calcification. CHEST: The primarily pleural-based/paravertebral mass along the left upper lobe extending from the apex down adjacent to the posterior arch is highly hypermetabolic compatible with malignancy, maximum SUV 12.2. Suspected neural foraminal extension and extension into the left side of the spinal  canal at the T3-4 level. The AP window lymph node of concern on prior chest CT has a maximum SUV of 3.8. Incidental CT findings: Coronary, aortic arch, and branch vessel atherosclerotic vascular disease. Focal anterior pericardial calcification, unchanged. Small right pleural effusion with adjacent atelectasis. Biapical pleuroparenchymal scarring. ABDOMEN/PELVIS: Located between the broad left transverse process of L5 and the adjacent posterior iliac crest, just anterior to the longissimus thoracis muscle and just above the sacroiliac joint, a 2.5 by 1.5 cm soft tissue density lesion on image 188 of series 3 is present with a maximum SUV of 7.0. Possibilities include metastatic lesion or schwannoma. Incidental CT findings: Right hepatic lobe cyst 1.9 cm in diameter on image 128 series 3. Aortoiliac atherosclerotic vascular disease. Small bilateral adrenal adenomas. Mild sigmoid colon diverticulosis. SKELETON: No significant abnormal hypermetabolic activity in this region. Incidental CT findings: Sclerosis along the SI joints compatible with bilateral sacroiliitis. IMPRESSION: 1. The left upper lobe pleural-based/paravertebral mass has a maximum SUV of 12.2, compatible with malignancy. Suspected extension into the spinal canal at the T3-4 level. Possibilities might include lung cancer, pleural tumor such as mesothelioma, or lymphoma. Mildly hypermetabolic and mildly enlarged AP window  lymph node concerning for early spread/involvement. 2. Unusual moderately hypermetabolic lesion just above the left SI joint, maximum SUV 7.0, potentially from metastatic lesion or schwannoma. 3. Other imaging findings of potential clinical significance: Aortic Atherosclerosis (ICD10-I70.0). Coronary atherosclerosis. Small right pleural effusion. Small bilateral adrenal adenomas. Mild sigmoid colon diverticulosis. Chronic bilateral sacroiliitis. Electronically Signed   By: Van Clines M.D.   On: 05/06/2020 09:42   CT  Biopsy  Result Date: 05/26/2020 INDICATION: 80 year old with a left chest mass and previous biopsy was inconclusive. Patient also has hypermetabolic soft tissue posterior to the left SI joint. EXAM: CT-GUIDED BIOPSY OF PELVIC SOFT TISSUE MEDICATIONS: Moderate sedation ANESTHESIA/SEDATION: Moderate (conscious) sedation was employed during this procedure. A total of Versed 2.0 mg and Fentanyl 100 mcg was administered intravenously. Moderate Sedation Time: 22 minutes. The patient's level of consciousness and vital signs were monitored continuously by radiology nursing throughout the procedure under my direct supervision. FLUOROSCOPY TIME:  None COMPLICATIONS: None immediate. PROCEDURE: The procedure was explained to the patient. The risks and benefits of the procedure were discussed and the patient's questions were addressed. Informed consent was obtained from the patient. Patient was placed prone. Time-out was performed. CT images through the pelvis were obtained. The soft tissue posterior to the left SI joint was identified. The overlying skin was prepped with chlorhexidine and sterile field was created. Skin and soft tissues were anesthetized using 1% lidocaine. Small incision was made. Using CT guidance, 17 gauge coaxial needle was directed towards the soft tissue and positioned just superficial to the lesion. Core biopsies were obtained with an 18 gauge device. Core biopsies were placed on a Telfa pad with saline. Needle was removed without complication. Bandage placed over the puncture site. FINDINGS: Soft tissue along the superior and posterior aspect of the left SI joint was identified and targeted. Coaxial needle was positioned just superficial to the lesion and adequate core specimens were obtained. IMPRESSION: CT-guided core biopsies of the soft tissue posterior to the left SI joint. Electronically Signed   By: Markus Daft M.D.   On: 05/26/2020 13:34   CT BIOPSY  Result Date: 05/12/2020 INDICATION: No  known primary, now with hypermetabolic pleural thickening involving the medial aspect the left upper lobe. Patient presents today for CT-guided biopsy for tissue diagnostic purposes. Given encroachment upon the left T3-T4 neural foramina, patient has been initiated steroid therapy. EXAM: CT-GUIDED BIOPSY OF HYPERMETABOLIC PLEURAL THICKENING INVOLVING THE MEDIAL ASPECT OF THE LEFT UPPER LOBE. COMPARISON:  PET-CT-05/06/2020; chest CT-04/22/2020; 06/22/2011 MEDICATIONS: None. ANESTHESIA/SEDATION: Fentanyl 100 mcg IV; Versed 2 mg IV Sedation time: 12 minutes; The patient was continuously monitored during the procedure by the interventional radiology nurse under my direct supervision. CONTRAST:  None COMPLICATIONS: None immediate. PROCEDURE: Informed consent was obtained from the patient following an explanation of the procedure, risks, benefits and alternatives. The patient understands,agrees and consents for the procedure. All questions were addressed. A time out was performed prior to the initiation of the procedure. The patient was positioned prone on the CT table and a limited chest CT was performed for procedural planning demonstrating interval reduction in size the hypermetabolic pleural thickening with dominant residual nodular component now measuring approximately 1.8 x 1.5 cm (image 12, series 2), previously measuring at least 6.1 x 2.1 cm. Patient was then positioned supine on the CT gantry with limited CT imaging confirming interval reduction in size of the hypermetabolic pleural thickening Above was discussed with referring oncologist Dr. Janese Banks, and the decision was made to proceed with  CT-guided biopsy despite interval reduction in size as it was felt the lesion may represent a lymphoma with interval improvement secondary initiation of steroids. As such, the patient was repositioned prone on the CT table. A limited chest CT was performed for procedural planning purposes. The operative site was prepped and  draped in the usual sterile fashion. Under sterile conditions and local anesthesia, a 17 gauge coaxial needle was advanced into the peripheral aspect of the residual hypermetabolic pleural thickening involving medial aspect left upper lobe. Positioning was confirmed with intermittent CT fluoroscopy and followed by the acquisition of 3 core needle biopsies with an 18 gauge core needle biopsy device and placed in saline for analysis. The coaxial needle was removed following deployment of a Biosentry plug and superficial hemostasis was achieved with manual compression. Limited post procedural chest CT was negative for pneumothorax or additional complication. A dressing was placed. The patient tolerated the procedure well without immediate postprocedural complication. The patient was escorted to have an upright chest radiograph. IMPRESSION: 1. Technically successful CT guided core needle core biopsy of residual hypermetabolic pleural thickening involving medial aspect of the left upper lobe. 2. Note, the hypermetabolic pleural thickening has reduced compared to PET-CT performed 05/06/2020 though this may be attributable to response to the initiation of steroids potentially indicative of a lymphoma. Electronically Signed   By: Sandi Mariscal M.D.   On: 05/12/2020 13:37   DG Chest Port 1 View  Result Date: 05/12/2020 CLINICAL DATA:  Post CT-guided biopsy of hypermetabolic pleural thickening involving the medial aspect the left upper lobe. EXAM: PORTABLE CHEST 1 VIEW COMPARISON:  Chest CT-04/22/2020 PET-CT-05/06/2020 CT-guided biopsy of hypermetabolic pleural thickening involving the medial lobe-earlier same day FINDINGS: Borderline enlarged cardiac silhouette and mediastinal contours with atherosclerotic plaque within the thoracic aorta. Known pleural thickening involving the medial aspect of the left upper lobe is suboptimally evaluated on the present examination. Redemonstrated mild diffuse slightly nodular thickening  of the pulmonary interstitium. No focal airspace opacities. No pleural effusion or pneumothorax. No evidence of edema. No acute osseous abnormalities. IMPRESSION: No evidence of complication following left upper lobe pulmonary nodule biopsy. Specifically, no evidence of pneumothorax. Electronically Signed   By: Sandi Mariscal M.D.   On: 05/12/2020 13:57    No images are attached to the encounter.   CMP Latest Ref Rng & Units 05/03/2020  Glucose 70 - 99 mg/dL 111(H)  BUN 8 - 23 mg/dL 15  Creatinine 0.44 - 1.00 mg/dL 1.12(H)  Sodium 135 - 145 mmol/L 139  Potassium 3.5 - 5.1 mmol/L 4.4  Chloride 98 - 111 mmol/L 102  CO2 22 - 32 mmol/L 27  Calcium 8.9 - 10.3 mg/dL 9.0  Total Protein 6.5 - 8.1 g/dL 8.0  Total Bilirubin 0.3 - 1.2 mg/dL 0.5  Alkaline Phos 38 - 126 U/L 92  AST 15 - 41 U/L 14(L)  ALT 0 - 44 U/L 11   CBC Latest Ref Rng & Units 05/26/2020  WBC 4.0 - 10.5 K/uL 12.4(H)  Hemoglobin 12.0 - 15.0 g/dL 12.7  Hematocrit 36.0 - 46.0 % 40.2  Platelets 150 - 400 K/uL 172     Observation/objective: Appears in no acute distress over video visit today.  Breathing is nonlabored  Assessment and plan: Patient is a 80 year old female with a pleural-based paravertebral mass involving the left upper lobe and soft tissue lesion adjacent to left sacroiliac joint of unclear etiology  Patient has had 2 core biopsies involving the pleural-based mass as well as the soft tissue  lesion in the sacroiliac joint both of which were nondiagnostic for malignancy but appears morphologically similar.  Given that her primary left upper lobe lung mass was close to 6-1/2 cm it is concerning for malignancy especially given the spinal cord involvement.  At this time I would recommend proceeding with VATS guided tissue biopsy as discussed in tumor board.  Discussed the need to see cardiothoracic surgery for this and I gave her options for [facility] [place] as well as [facility] surgery from [place].  Patient would like  to get all her care at [facility] as much as possible but I explained to her that we would not have any cardiothoracic surgery here at [facility] presently.  Patient would like to think about her options and get back to [place] next week and we will follow up with her with a phone call  1.  Most desirable option is to proceed with a VATS guided biopsy for definitive tissue diagnosis 2.  If patient declines VATS guided biopsy it remains to be seen if Dr. Baruch Gouty would want to empirically radiate her without knowing what the pathology is.  The concern is the spinal cord involvement her ongoing back pain however. 3.  Least desirable option would be surveillance CT in 3 months without any further tissue diagnosis  Follow-up instructions: Follow-up to be decided  I discussed the assessment and treatment plan with the patient. The patient was provided an opportunity to ask questions and all were answered. The patient agreed with the plan and demonstrated an understanding of the instructions.   The patient was advised to call back or seek an in-person evaluation if the symptoms worsen or if the condition fails to improve as anticipated.   Visit Diagnosis: 1. Lung mass     Dr. Randa Evens, MD, MPH [facility] at [facility] Tel- [phone] 06/04/2020 2:56 PM

## 2020-06-07 ENCOUNTER — Ambulatory Visit: Payer: Medicare PPO

## 2020-06-07 ENCOUNTER — Encounter: Payer: Self-pay | Admitting: *Deleted

## 2020-06-07 LAB — SURGICAL PATHOLOGY

## 2020-06-07 NOTE — Progress Notes (Signed)
  Oncology Nurse Navigator Documentation  Navigator Location: [facility] (06/07/20 1100)   )Navigator Encounter Type: Telephone (06/07/20 1100) Telephone: Lahoma Crocker Call (06/07/20 1100)                       Barriers/Navigation Needs: Coordination of Care;Education (06/07/20 1100) Education: Preparing for Upcoming Surgery/ Treatment;Accessing Care/ Finding Providers (06/07/20 1100) Interventions: Education (06/07/20 1100)     Education Method: Verbal (06/07/20 1100)         phone call made to patient to review options as discussed with Dr. Janese Banks previously. All questions answered during call. Pt stated that she is hesitant about invasive procedures and would rather like to wait 3 months to follow up with imaging. Pt informed that in 3 months she could be at risk for the mass growing to cause damage to her spinal cord. Recommended to discuss VATS procedure with thoracic surgeon first then decide if she still wants to wait or proceed with the procedure. Pt stated that she would like to call me back tomorrow to let me know her decision after thinking through her options today. Will await pt's call. Pt informed that radiation has been cancelled at this time. Pt verbalized understanding.        Time Spent with Patient: 30 (06/07/20 1100)

## 2020-06-08 ENCOUNTER — Ambulatory Visit: Payer: Medicare PPO

## 2020-06-08 ENCOUNTER — Telehealth: Payer: Self-pay | Admitting: *Deleted

## 2020-06-08 ENCOUNTER — Telehealth: Payer: Self-pay | Admitting: Oncology

## 2020-06-08 DIAGNOSIS — R918 Other nonspecific abnormal finding of lung field: Secondary | ICD-10-CM

## 2020-06-08 NOTE — Telephone Encounter (Signed)
See haiku message

## 2020-06-08 NOTE — Telephone Encounter (Signed)
Spoke with patient and she has decided she would like to proceed with follow up imaging in 3 months. She is not interested in VATS at this time or pursuing empiric radiation. Pt informed that if back pain worsens to let us know so we can get her CT scan scheduled sooner. Pt verbalized understanding.  Dr. Janese Banks- do you want me to go ahead and schedule her follow up imaging?

## 2020-06-08 NOTE — Telephone Encounter (Signed)
Attempted to call pt to notify her of scheduled PET scan and MD f/u. Line is busy. Mailing appts.

## 2020-06-08 NOTE — Telephone Encounter (Signed)
Per Dr. Janese Banks will proceed with PET scan the third week of April then follow up with her 1-2 days to discuss results. If PET scan denied by insurance then will order CT chest abd pelvis with contrast.  Anderson Malta notified via secure chat to schedule appts and notify pt.   Nothing further needed at this time.

## 2020-06-09 ENCOUNTER — Ambulatory Visit: Payer: Medicare PPO

## 2020-06-10 ENCOUNTER — Ambulatory Visit: Payer: Medicare PPO

## 2020-06-11 ENCOUNTER — Ambulatory Visit: Payer: Medicare PPO

## 2020-06-14 ENCOUNTER — Ambulatory Visit: Payer: Medicare PPO

## 2020-06-14 DIAGNOSIS — R918 Other nonspecific abnormal finding of lung field: Secondary | ICD-10-CM | POA: Insufficient documentation

## 2020-06-14 DIAGNOSIS — N1831 Chronic kidney disease, stage 3a: Secondary | ICD-10-CM | POA: Insufficient documentation

## 2020-06-15 ENCOUNTER — Ambulatory Visit: Payer: Medicare PPO

## 2020-06-16 ENCOUNTER — Ambulatory Visit: Payer: Medicare PPO

## 2020-06-17 ENCOUNTER — Ambulatory Visit: Payer: Medicare PPO

## 2020-06-18 ENCOUNTER — Ambulatory Visit: Payer: Medicare PPO

## 2020-06-21 ENCOUNTER — Ambulatory Visit: Payer: Medicare PPO

## 2020-06-22 ENCOUNTER — Ambulatory Visit: Payer: Medicare PPO

## 2020-06-23 ENCOUNTER — Ambulatory Visit: Payer: Medicare PPO

## 2020-06-24 ENCOUNTER — Ambulatory Visit: Payer: Medicare PPO

## 2020-06-25 ENCOUNTER — Ambulatory Visit: Payer: Medicare PPO

## 2020-06-28 ENCOUNTER — Ambulatory Visit: Payer: Medicare PPO

## 2020-06-29 ENCOUNTER — Ambulatory Visit: Payer: Medicare PPO

## 2020-07-26 ENCOUNTER — Ambulatory Visit
Admission: RE | Admit: 2020-07-26 | Discharge: 2020-07-26 | Disposition: A | Discharge status: Home / Self Care | Payer: Medicare PPO | Source: Ambulatory Visit | Attending: Oncology | Admitting: Oncology

## 2020-07-26 ENCOUNTER — Other Ambulatory Visit: Payer: Self-pay

## 2020-07-26 DIAGNOSIS — R918 Other nonspecific abnormal finding of lung field: Secondary | ICD-10-CM | POA: Insufficient documentation

## 2020-07-26 LAB — GLUCOSE, CAPILLARY: Glucose-Capillary: 98 mg/dL (ref 70–99)

## 2020-07-26 MED ORDER — FLUDEOXYGLUCOSE F - 18 (FDG) INJECTION
7.8000 | Freq: Once | INTRAVENOUS | Status: AC | PRN
  Administered 2020-07-26: 8.11 via INTRAVENOUS

## 2020-07-29 ENCOUNTER — Inpatient Hospital Stay: Payer: Medicare PPO | Attending: Oncology | Admitting: Oncology

## 2020-07-29 ENCOUNTER — Encounter: Payer: Self-pay | Admitting: Oncology

## 2020-07-29 DIAGNOSIS — F17210 Nicotine dependence, cigarettes, uncomplicated: Secondary | ICD-10-CM | POA: Diagnosis not present

## 2020-07-29 DIAGNOSIS — R918 Other nonspecific abnormal finding of lung field: Secondary | ICD-10-CM | POA: Diagnosis not present

## 2020-07-29 DIAGNOSIS — Z7952 Long term (current) use of systemic steroids: Secondary | ICD-10-CM | POA: Insufficient documentation

## 2020-07-31 ENCOUNTER — Encounter: Payer: Self-pay | Admitting: Oncology

## 2020-07-31 NOTE — Progress Notes (Signed)
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 She presented with symptoms of Mid back pain which prompted x-rays which led to CT scan. CT chest without contrast showed subpleural mass in the posterior medial left upper lobe measuring 5.4 x 3.2 x 7.9 cm. Tumor extends into left T2-T3, T3-T4 and T4-T5 neural foramina. Mass-effect upon the cord. Several adjacent satellite nodules identified in the paravertebral left upper lobe. Bilateral low-attenuation adrenal nodules. 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 Patient is currently on tramadol and uses mainly ibuprofen for her back pain.  Given the significant back pain and concern for spinal cord involvement patient was referred to radiation oncology and started on steroids.  A week after starting steroids when patient went for a biopsy left upper lobe subpleural mass appeared significantly smaller Measuring 1.8 x 1.5 cm as compared to 6.1 x 2.1 cm prior.  Core biopsy was performed but was not diagnostic of malignancy and showed dense fibrous tissue with mixed inflammatory infiltrate.  Given that the PET CT scan also showed area of hypermetabolism in the soft tissue density adjacent to PSIS a second biopsy of that region was attempted but was again nondiagnostic of malignancy.  While awaiting biopsy results patient had received 2 or 3 radiation  treatments given that the mass was involving spinal cord at T3-T4 level.  Interval history-patient reports occasional back pain which she states has been chronic even prior to the diagnosis of the lung mass.  Appetite and weight is doing well.  Denies any bowel bladder incontinence.  Denies any weakness or numbness in her extremities.  ECOG PS- 1 Pain scale- 2   Review of systems- Review of Systems  Constitutional: Negative for chills, fever, malaise/fatigue and weight loss.  HENT: Negative for congestion, ear discharge and nosebleeds.   Eyes: Negative for blurred vision.  Respiratory: Negative for cough, hemoptysis, sputum production, shortness of breath and wheezing.   Cardiovascular: Negative for chest pain, palpitations, orthopnea and claudication.  Gastrointestinal: Negative for abdominal pain, blood in stool, constipation, diarrhea, heartburn, melena, nausea and vomiting.  Genitourinary: Negative for dysuria, flank pain, frequency, hematuria and urgency.  Musculoskeletal: Positive for back pain. Negative for joint pain and myalgias.  Skin: Negative for rash.  Neurological: Negative for dizziness, tingling, focal weakness, seizures, weakness and headaches.  Endo/Heme/Allergies: Does not bruise/bleed easily.  Psychiatric/Behavioral: Negative for depression and suicidal ideas. The patient does not have insomnia.       Allergies  Allergen Reactions  . Aurothioglucose Other (See Comments)    Granulocytopenia  . Codeine Nausea Only and Nausea And Vomiting  . Methotrexate Derivatives     Liver enlargement  . Sulfa Antibiotics Nausea Only     Past Medical History:  Diagnosis Date  . 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 Coronary artery disease   . Hypercholesteremia   . Hypothyroidism   . Psoriasis   . Psoriatic arthritis ([place])   . Tooth abscess 05/31/2020     Past Surgical History:  Procedure Laterality Date  . CARPAL TUNNEL RELEASE  1988  . CATARACT EXTRACTION,  BILATERAL  1997  . CHOLECYSTECTOMY    . colonopscopy  2018  . COLONOSCOPY  2008  . LIVER BIOPSY  1986  . STRABISMUS SURGERY Left 1997  . TONSILLECTOMY  1945    Social History   Socioeconomic History  . Marital status: Married    Spouse name: [name]   . Number of children: 2  . Years of education: Not on file  . Highest education level: Not on file  Occupational History  . Occupation: retired     Comment: [employer]   Tobacco Use  . Smoking status: Current Some Day Smoker    Packs/day: 0.25    Years: 49.00    Pack years: 12.25    Types: Cigarettes  . Smokeless tobacco: Never Used  . Tobacco comment: 4 or 5 cigs a day  Vaping Use  . Vaping Use: Never used  Substance and Sexual Activity  . Alcohol use: Not Currently  . Drug use: Never  . Sexual activity: Yes  Other Topics Concern  . Not on file  Social History Narrative   Lives at home with spouse 57 years   Social Determinants of Health   Financial Resource Strain: Not on file  Food Insecurity: Not on file  Transportation Needs: Not on file  Physical Activity: Not on file  Stress: Not on file  Social Connections: Not on file  Intimate Partner Violence: Not on file    Family History  Problem Relation Age of Onset  . Diabetes Mother   . Diabetes Sister   . Cancer Maternal Uncle   . Cancer Paternal Uncle      Current Outpatient Medications:  .  Artificial Tear Solution (SOOTHE HYDRATION) 1.25 % SOLN, Apply 1 drop to eye as needed., Disp: , Rfl:  .  ibuprofen (ADVIL) 400 MG tablet, Take 400 mg by mouth every 6 (six) hours as needed., Disp: , Rfl:  .  levothyroxine (SYNTHROID) 88 MCG tablet, TAKE 1 TABLET BY MOUTH ONCE DAILY ON AN EMPTY STOMACH WITH A GLASS OF WATER AT LEAST 30-60 MINUTES BEFORE BREAKFAST, Disp: , Rfl:   Physical exam: There were no vitals filed for this visit. Physical Exam Constitutional:      General: She is not in acute distress. Cardiovascular:     Rate and Rhythm: Normal rate and  regular rhythm.     Heart sounds: Normal heart sounds.  Pulmonary:     Effort: Pulmonary effort is normal.     Breath sounds: Normal breath sounds.  Abdominal:     General: Bowel sounds are normal.     Palpations: Abdomen is soft.  Skin:    General: Skin is warm and dry.  Neurological:     General: No focal deficit present.     Mental Status: She is alert and oriented to person, place, and time.      CMP Latest Ref Rng & Units 05/03/2020  Glucose 70 - 99 mg/dL 111(H)  BUN 8 - 23 mg/dL 15  Creatinine 0.44 - 1.00 mg/dL 1.12(H)  Sodium 135 - 145 mmol/L 139  Potassium 3.5 - 5.1 mmol/L 4.4  Chloride 98 - 111 mmol/L 102  CO2 22 - 32 mmol/L 27  Calcium 8.9 - 10.3 mg/dL 9.0  Total Protein 6.5 - 8.1 g/dL 8.0  Total Bilirubin 0.3 - 1.2 mg/dL 0.5  Alkaline Phos 38 - 126 U/L 92  AST 15 - 41 U/L 14(L)  ALT 0 - 44 U/L 11   CBC Latest Ref Rng & Units 05/26/2020  WBC 4.0 - 10.5 K/uL 12.4(H)  Hemoglobin 12.0 - 15.0 g/dL 12.7  Hematocrit 36.0 - 46.0 % 40.2  Platelets 150 - 400 K/uL 172    No images are attached to the encounter.  NM PET Image Restag (PS) Skull Base To Thigh  Result Date: 07/26/2020 CLINICAL DATA:  Subsequent treatment strategy for left lung mass. EXAM: NUCLEAR MEDICINE PET SKULL BASE TO THIGH TECHNIQUE: 8.1 mCi F-18 FDG was injected intravenously. Full-ring PET imaging was performed from the skull base to thigh after the radiotracer. CT data was obtained and used for attenuation correction and anatomic localization. Fasting blood glucose: 98 mg/dl COMPARISON:  05/06/2020 FINDINGS: Mediastinal blood-pool activity (background): SUV max = 2.4 Liver activity (reference): SUV max = 3.2 NECK:  No hypermetabolic lymph nodes or masses. Incidental CT findings:  None. CHEST: Pleural-based soft tissue density which also involves paravertebral soft tissues in the medial left upper thorax shows decrease in size, currently measuring 2.8 x 1.2 cm on image 67/3, compared to 4.9 x 2.0 cm  previously. This shows decreased FDG uptake, with SUV max of 5.9 compared to 12.2 previously. No other suspicious pulmonary nodules or masses identified. No evidence of pleural effusion. Mild FDG uptake is seen in sub-cm mediastinal and bilateral and hilar lymph nodes, with highest SUV max in AP window a 3.5 compared to 3.8 previously. No new hypermetabolic lymph nodes identified. Incidental CT findings:  None. ABDOMEN/PELVIS: No abnormal hypermetabolic activity within the liver, pancreas, adrenal glands, or spleen. No hypermetabolic lymph nodes in the abdomen or pelvis. Previously seen soft tissue density along the superior aspect of right sacroiliac shows absence of FDG activity on today's exam. Incidental CT findings: Stable small cyst in the medial right hepatic lobe. Small low-attenuation bilateral adrenal nodules which show no FDG uptake, consistent with benign adrenal adenomas. Aortic atherosclerotic calcification noted. Prior hysterectomy again noted. Sigmoid diverticulosis again seen, without evidence of diverticulitis. SKELETON: No focal hypermetabolic bone lesions to suggest skeletal metastasis. Incidental CT findings:  None. IMPRESSION: Interval decrease in size and hypermetabolism of pleural based/paravertebral soft tissue density in the left upper hemithorax. Stable mild FDG uptake in sub-cm mediastinal and hilar lymph nodes. Resolution of hypermetabolic activity in soft tissue density along the superior margin of the left sacroiliac joint. No evidence of new or progressive disease. Electronically Signed   By: Marlaine Hind M.D.   On: 07/26/2020 14:55     Assessment and plan- Patient is a 80 y.o. 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  The mass had considerably decreased  in size.  She was also noted to have a 2.5 x 1.5 cm lesion about the sacroiliac joint.  Both the pleural-based mass and the sacroiliac joint mass was biopsied and they were both diagnostic for malignancy.  We discussed the patient's case at tumor board and had recommended vaccinated biopsy by thoracic surgery which the patient had declined.  She chose to get a repeat PET CT scan after 3 months.  Present PET CT scan continues to show decrease in the size of the pleural-based mass which is now down to 2.8 x 1.2 cm as compared to 4.9 x 2 cm previously.  SUV is  gone down from 12.2-5.9 but remains hypermetabolic and suspicious.  Sacroiliac joint masses no longer visualized.  Discussed with the patient that although she has not had any treatment for this mass as such it persists and remains mildly hypermetabolic.  Even at this point I would recommend a cardiothoracic surgery consultation for a VATS guided biopsy of the mass.  I would not recommend empiric chemotherapy or radiation therapy in the absence of tissue diagnosis.  Patient remains hesitant to go thoracic surgery and chooses for continued surveillance at this time.  I will see her back in 3 months with CT chest with contrast   Visit Diagnosis 1. Lung mass      Dr. Randa Evens, MD, MPH [facility] [phone] 07/31/2020 7:59 AM

## 2020-08-05 ENCOUNTER — Encounter: Payer: Self-pay | Admitting: *Deleted

## 2020-10-29 ENCOUNTER — Other Ambulatory Visit: Payer: Self-pay

## 2020-10-29 ENCOUNTER — Ambulatory Visit
Admission: RE | Admit: 2020-10-29 | Discharge: 2020-10-29 | Disposition: A | Discharge status: Home / Self Care | Payer: Medicare PPO | Source: Ambulatory Visit | Attending: Oncology | Admitting: Oncology

## 2020-10-29 DIAGNOSIS — R918 Other nonspecific abnormal finding of lung field: Secondary | ICD-10-CM | POA: Insufficient documentation

## 2020-10-29 LAB — POCT I-STAT CREATININE: Creatinine, Ser: 1.2 mg/dL — ABNORMAL HIGH (ref 0.44–1.00)

## 2020-10-29 MED ORDER — IOHEXOL 350 MG/ML SOLN
75.0000 mL | Freq: Once | INTRAVENOUS | Status: AC | PRN
  Administered 2020-10-29: 60 mL via INTRAVENOUS

## 2020-11-01 ENCOUNTER — Encounter: Payer: Self-pay | Admitting: Oncology

## 2020-11-01 ENCOUNTER — Other Ambulatory Visit: Payer: Self-pay

## 2020-11-01 ENCOUNTER — Inpatient Hospital Stay: Payer: Medicare PPO | Attending: Oncology | Admitting: Oncology

## 2020-11-01 VITALS — BP 128/79 | HR 74 | Temp 99.2°F | Resp 18 | Wt 149.2 lb

## 2020-11-01 DIAGNOSIS — R918 Other nonspecific abnormal finding of lung field: Secondary | ICD-10-CM | POA: Diagnosis present

## 2020-11-01 NOTE — Progress Notes (Signed)
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  She presented with symptoms of Mid back pain which prompted x-rays which led to CT scan.  CT chest without contrast showed subpleural mass in the posterior medial left upper lobe measuring 5.4 x 3.2 x 7.9 cm.  Tumor extends into left T2-T3, T3-T4 and T4-T5 neural foramina.  Mass-effect upon the cord.  Several adjacent satellite nodules identified in the paravertebral left upper lobe.  Bilateral low-attenuation adrenal nodules.  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  Patient is currently on tramadol and uses mainly ibuprofen for her back pain.   Given the significant back pain and concern for spinal cord involvement patient was referred to radiation oncology and started on steroids.  A week after starting steroids when patient went for a biopsy left upper lobe subpleural mass appeared significantly smaller Measuring 1.8 x 1.5 cm as compared to 6.1 x 2.1 cm prior.  Core biopsy was performed but was not diagnostic of malignancy and showed dense fibrous tissue with mixed inflammatory infiltrate.  Given that the PET CT scan also showed area of hypermetabolism in the soft tissue density adjacent to PSIS a second biopsy of that region was attempted but was again nondiagnostic of malignancy.  While awaiting biopsy results patient had received 2 or 3 radiation treatments  given that the mass was involving spinal cord at T3-T4 level.    Interval history-patient reports doing very well and denies any specific complaints at this time.  She remains active and independent of her ADLs.  Denies any back pain or abdominal pain.  Denies any tingling numbness or sensory changes over abdominal wall or extremities.  ECOG PS- 1 Pain scale- 0   Review of systems- Review of Systems  Constitutional:  Negative for chills, fever, malaise/fatigue and weight loss.  HENT:  Negative for congestion, ear discharge and nosebleeds.   Eyes:  Negative for blurred vision.  Respiratory:  Negative for cough, hemoptysis, sputum production, shortness of breath and wheezing.   Cardiovascular:  Negative for chest pain, palpitations, orthopnea and claudication.  Gastrointestinal:  Negative for abdominal pain, blood in stool, constipation, diarrhea, heartburn, melena, nausea and vomiting.  Genitourinary:  Negative for dysuria, flank pain, frequency, hematuria and urgency.  Musculoskeletal:  Negative for back pain, joint pain and myalgias.  Skin:  Negative for rash.  Neurological:  Negative for dizziness, tingling, focal weakness, seizures, weakness and headaches.  Endo/Heme/Allergies:  Does not bruise/bleed easily.  Psychiatric/Behavioral:  Negative for depression and suicidal ideas. The patient does not have insomnia.       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    Coronary artery disease    Hypercholesteremia    Hypothyroidism  Psoriasis    Psoriatic arthritis ([place])    Tooth abscess 05/31/2020     Past Surgical History:  Procedure Laterality Date   CARPAL TUNNEL RELEASE  1988   CATARACT EXTRACTION, BILATERAL  1997    CHOLECYSTECTOMY     colonopscopy  2018   COLONOSCOPY  2008   LIVER BIOPSY  1986   STRABISMUS SURGERY Left 1997   TONSILLECTOMY  1945    Social History   Socioeconomic History   Marital status: Married    Spouse name: John    Number of children: 2   Years of education: Not on file   Highest education level: Not on file  Occupational History   Occupation: retired     Comment: [employer]   Tobacco Use   Smoking status: Some Days    Packs/day: 0.25    Years: 49.00    Pack years: 12.25    Types: Cigarettes   Smokeless tobacco: Never   Tobacco comments:    4 or 5 cigs a day  Vaping Use   Vaping Use: Never used  Substance and Sexual Activity   Alcohol use: Not Currently   Drug use: Never   Sexual activity: Yes  Other Topics Concern   Not on file  Social History Narrative   Lives at home with spouse 25 years   Social Determinants of Health   Financial Resource Strain: Not on file  Food Insecurity: Not on file  Transportation Needs: Not on file  Physical Activity: Not on file  Stress: Not on file  Social Connections: Not on file  Intimate Partner Violence: Not on file    Family History  Problem Relation Age of Onset   Diabetes Mother    Diabetes Sister    Cancer Maternal Uncle    Cancer Paternal Uncle      Current Outpatient Medications:    Artificial Tear Solution (SOOTHE HYDRATION) 1.25 % SOLN, Apply 1 drop to eye as needed., Disp: , Rfl:    ibuprofen (ADVIL) 200 MG tablet, Take by mouth., Disp: , Rfl:    levothyroxine (SYNTHROID) 88 MCG tablet, TAKE 1 TABLET BY MOUTH ONCE DAILY ON AN EMPTY STOMACH WITH A GLASS OF WATER AT LEAST 30-60 MINUTES BEFORE BREAKFAST, Disp: , Rfl:    ibuprofen (ADVIL) 400 MG tablet, Take 400 mg by mouth every 6 (six) hours as needed. (Patient not taking: Reported on 11/01/2020), Disp: , Rfl:   Physical exam:  Vitals:   11/01/20 1421  BP: 128/79  Pulse: 74  Resp: 18  Temp: 99.2 F (37.3 C)  SpO2: 98%  Weight: 149 lb 3.2  oz (67.7 kg)   Physical Exam Constitutional:      General: She is not in acute distress. Cardiovascular:     Rate and Rhythm: Normal rate and regular rhythm.     Heart sounds: Normal heart sounds.  Pulmonary:     Effort: Pulmonary effort is normal.     Breath sounds: Normal breath sounds.  Abdominal:     General: Bowel sounds are normal.     Palpations: Abdomen is soft.  Skin:    General: Skin is warm and dry.  Neurological:     Mental Status: She is alert and oriented to person, place, and time.     CMP Latest Ref Rng & Units 10/29/2020  Glucose 70 - 99 mg/dL -  BUN 8 - 23 mg/dL -  Creatinine 0.44 - 1.00 mg/dL 1.20(H)  Sodium 135 - 145 mmol/L -  Potassium  3.5 - 5.1 mmol/L -  Chloride 98 - 111 mmol/L -  CO2 22 - 32 mmol/L -  Calcium 8.9 - 10.3 mg/dL -  Total Protein 6.5 - 8.1 g/dL -  Total Bilirubin 0.3 - 1.2 mg/dL -  Alkaline Phos 38 - 126 U/L -  AST 15 - 41 U/L -  ALT 0 - 44 U/L -   CBC Latest Ref Rng & Units 05/26/2020  WBC 4.0 - 10.5 K/uL 12.4(H)  Hemoglobin 12.0 - 15.0 g/dL 12.7  Hematocrit 36.0 - 46.0 % 40.2  Platelets 150 - 400 K/uL 172    No images are attached to the encounter.  CT Chest W Contrast  Result Date: 10/31/2020 CLINICAL DATA:  Left lung mass, follow-up. EXAM: CT CHEST WITH CONTRAST TECHNIQUE: Multidetector CT imaging of the chest was performed during intravenous contrast administration. 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 Heterogeneous atherosclerotic plaque throughout the thoracic aorta. Normal pulmonary artery. No evidence of pulmonary embolism. Atherosclerotic calcifications present along the coronary arteries. The heart is normal in size. Focal calcification in the anterior pericardium remains stable. Mediastinum/Nodes: No suspicious mediastinal adenopathy. Unremarkable esophagus.  Lungs/Pleura: Infiltrating soft tissue mass within the left posterior mediastinum appears grossly unchanged compared to prior PET-CT imaging. The mass is elongated in the craniocaudal dimension and invades the left-sided neural foramen at T2-T3, T3-T4 and T4-T5. Persistent encroachment and displacement of the thecal sac at T3-T4. Measuring at this location similar to the prior study, the mass measures approximately 3.0 x 1.1 cm compared to 2.8 x 1.2 cm. This represents an insignificant change which is likely due to measurement technique given the amorphous shape. Stable linear scarring in the right lung apex. Scattered small pulmonary nodules measuring between 1 and 4 mm demonstrate no interval change compared to prior imaging and are likely benign granulomata. Upper Abdomen: Stable hepatic cyst. 1.9 cm low-attenuation left adrenal nodule remains unchanged. Low attenuation within the right adrenal gland. No acute abnormality within the upper abdomen. Musculoskeletal: No acute fracture or aggressive appearing lytic or blastic osseous lesion. IMPRESSION: 1. No significant interval change in the size or appearance of the infiltrating mass in the left posterior mediastinum which invades the left-sided neural foramina at T2-T3, T3-T4 and T4-T5. 2. 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 Recommend annual imaging followup by CTA or MRA. This recommendation follows 2010 ACCF/AHA/AATS/ACR/ASA/SCA/SCAI/SIR/STS/SVM Guidelines for the Diagnosis and Management of Patients with Thoracic Aortic Disease. Circulation. 2010; 121: C947-S962. Aortic aneurysm NOS (ICD10-I71.9) Aortic Atherosclerosis (ICD10-I70.0). 3. Coronary artery calcifications. 4. Additional ancillary findings as above without significant interval change. Electronically Signed   By: Jacqulynn Cadet M.D.   On: 10/31/2020 12:02     Assessment and plan- Patient is a 80 y.o. female with pleural based paravertebral mass involving  the left upper lobe of unclear etiology here to discuss CT scan results and further management  I have reviewed CT chest images independently and discussed findings with the patient and her husband.Overall there has been no significant change in the posterior mediastinal/left upper lobe mass which is roughly 3 x 1.1 cm presently and involving the neural foramina at the T2-T3 T3-T4 and T4-T5 region.  This was compared to prior PET scan from April 2022.  This mass was significantly larger in January 2022 and because of concern for potential cord compression I had started  her on steroids for a few days which resulted in significant regression in the size of the mass on PET scan in January 2022.  Subsequently we have attempted biopsy x2 which has not been diagnostic of malignancy.  I again presented to her the following options moving forward  Proceeding with open biopsy consideration with CT surgery Therapy attempted IR guided biopsy Watchful monitoring with a repeat CT chest abdomen and pelvis without contrast in 3 to 4 months.  It is unclear of the mass for remained stable or grow at that point and it is difficult to predict that without ascertaining the underlying etiology.  Patient understands the downsides of this approach and wishes to proceed with conservative monitoring with a repeat CT scan in 3 months  I will see her back in 3 months after repeat CT.  Patient knows to call us if she has any worsening back pain or neurological symptoms   Visit Diagnosis 1. Lung mass      Dr. Randa Evens, MD, MPH [facility] [phone] 11/01/2020 4:30 PM

## 2021-02-11 ENCOUNTER — Ambulatory Visit
Admission: RE | Admit: 2021-02-11 | Discharge: 2021-02-11 | Disposition: A | Discharge status: Home / Self Care | Payer: Medicare PPO | Source: Ambulatory Visit | Attending: Oncology | Admitting: Oncology

## 2021-02-11 ENCOUNTER — Other Ambulatory Visit: Payer: Self-pay

## 2021-02-11 DIAGNOSIS — I700 Atherosclerosis of aorta: Secondary | ICD-10-CM | POA: Insufficient documentation

## 2021-02-11 DIAGNOSIS — R918 Other nonspecific abnormal finding of lung field: Secondary | ICD-10-CM | POA: Insufficient documentation

## 2021-02-14 ENCOUNTER — Inpatient Hospital Stay: Payer: Medicare PPO | Attending: Oncology | Admitting: Oncology

## 2021-02-14 ENCOUNTER — Other Ambulatory Visit: Payer: Self-pay

## 2021-02-14 ENCOUNTER — Encounter: Payer: Self-pay | Admitting: Oncology

## 2021-02-14 VITALS — BP 116/77 | HR 90 | Temp 98.7°F | Resp 16 | Wt 147.5 lb

## 2021-02-14 DIAGNOSIS — R918 Other nonspecific abnormal finding of lung field: Secondary | ICD-10-CM | POA: Insufficient documentation

## 2021-02-14 NOTE — Progress Notes (Signed)
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  She presented with symptoms of Mid back pain which prompted x-rays which led to CT scan.  CT chest without contrast showed subpleural mass in the posterior medial left upper lobe measuring 5.4 x 3.2 x 7.9 cm.  Tumor extends into left T2-T3, T3-T4 and T4-T5 neural foramina.  Mass-effect upon the cord.  Several adjacent satellite nodules identified in the paravertebral left upper lobe.  Bilateral low-attenuation adrenal nodules.  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  Patient is currently on tramadol and uses mainly ibuprofen for her back pain.   Given the significant back pain and concern for spinal cord involvement patient was referred to radiation oncology and started on steroids.  A week after starting steroids when patient went for a biopsy left upper lobe subpleural mass appeared significantly smaller Measuring 1.8 x 1.5 cm as compared to 6.1 x 2.1 cm prior.  Core biopsy was performed but was not diagnostic of malignancy and showed dense fibrous tissue with mixed inflammatory infiltrate.  Given that the PET CT scan also showed area of hypermetabolism in the soft tissue density adjacent to PSIS a second biopsy of that region was attempted but was again nondiagnostic of malignancy.  While awaiting biopsy results patient had received 2 or 3 radiation treatments  given that the mass was involving spinal cord at T3-T4 level.  She is presently being observed conservatively with repeated scans for the mass.  Patient did not wish to pursue an open biopsy  Interval history-patient is doing well for her age overall.  Appetite and weight have remained stable.  She has mild intermittent back pain which has been chronic and has not worsened.  Denies any significant weakness in her upper extremities.  Reports occasional tingling in her left arm which has not bothered her.  ECOG PS- 1 Pain scale- 0   Review of systems- Review of Systems  Constitutional:  Negative for chills, fever, malaise/fatigue and weight loss.  HENT:  Negative for congestion, ear discharge and nosebleeds.   Eyes:  Negative for blurred vision.  Respiratory:  Negative for cough, hemoptysis, sputum production, shortness of breath and wheezing.   Cardiovascular:  Negative for chest pain, palpitations, orthopnea and claudication.  Gastrointestinal:  Negative for abdominal pain, blood in stool, constipation, diarrhea, heartburn, melena, nausea and vomiting.  Genitourinary:  Negative for dysuria, flank pain, frequency, hematuria and urgency.  Musculoskeletal:  Negative for back pain, joint pain and myalgias.  Skin:  Negative for rash.  Neurological:  Negative for dizziness, tingling, focal weakness, seizures, weakness and headaches.  Endo/Heme/Allergies:  Does not bruise/bleed easily.  Psychiatric/Behavioral:  Negative for depression and suicidal ideas. The patient does not have insomnia.     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    Coronary artery disease    Hypercholesteremia    Hypothyroidism    Psoriasis    Psoriatic arthritis  ([place])    Tooth abscess 05/31/2020     Past Surgical History:  Procedure Laterality Date   CARPAL TUNNEL RELEASE  1988   CATARACT EXTRACTION, BILATERAL  1997   CHOLECYSTECTOMY     colonopscopy  2018   COLONOSCOPY  2008   LIVER BIOPSY  1986   STRABISMUS SURGERY Left 1997   TONSILLECTOMY  1945    Social History   Socioeconomic History   Marital status: Married    Spouse name: John    Number of children: 2   Years of education: Not on file   Highest education level: Not on file  Occupational History   Occupation: retired     Comment: [employer]   Tobacco Use   Smoking status: Some Days    Packs/day: 0.25    Years: 49.00    Pack years: 12.25    Types: Cigarettes   Smokeless tobacco: Never   Tobacco comments:    4 or 5 cigs a day  Vaping Use   Vaping Use: Never used  Substance and Sexual Activity   Alcohol use: Not Currently   Drug use: Never   Sexual activity: Yes  Other Topics Concern   Not on file  Social History Narrative   Lives at home with spouse 36 years   Social Determinants of Health   Financial Resource Strain: Not on file  Food Insecurity: Not on file  Transportation Needs: Not on file  Physical Activity: Not on file  Stress: Not on file  Social Connections: Not on file  Intimate Partner Violence: Not on file    Family History  Problem Relation Age of Onset   Diabetes Mother    Diabetes Sister    Cancer Maternal Uncle    Cancer Paternal Uncle      Current Outpatient Medications:    Artificial Tear Solution (SOOTHE HYDRATION) 1.25 % SOLN, Apply 1 drop to eye as needed., Disp: , Rfl:    ibuprofen (ADVIL) 200 MG tablet, Take by mouth., Disp: , Rfl:    levothyroxine (SYNTHROID) 88 MCG tablet, TAKE 1 TABLET BY MOUTH ONCE DAILY ON AN EMPTY STOMACH WITH A GLASS OF WATER AT LEAST 30-60 MINUTES BEFORE BREAKFAST, Disp: , Rfl:    vitamin B-12 (CYANOCOBALAMIN) 1000 MCG tablet, Take 1,000 mcg by mouth daily., Disp: , Rfl:    ibuprofen (ADVIL) 400  MG tablet, Take 400 mg by mouth every 6 (six) hours as needed. (Patient not taking: No sig reported), Disp: , Rfl:   Physical exam:  Vitals:   02/14/21 1403  BP: 116/77  Pulse: 90  Resp: 16  Temp: 98.7 F (37.1 C)  SpO2: 95%  Weight: 147 lb 8 oz (66.9 kg)   Physical Exam Constitutional:      General: She is not in acute distress. Cardiovascular:     Rate and Rhythm: Normal rate and regular rhythm.     Heart sounds: Normal heart sounds.  Pulmonary:     Effort: Pulmonary effort is normal.     Breath sounds: Normal breath sounds.  Abdominal:     General: Bowel sounds are normal.     Palpations: Abdomen is soft.  Skin:    General: Skin is warm and dry.  Neurological:     Mental Status: She is alert  and oriented to person, place, and time.     CMP Latest Ref Rng & Units 10/29/2020  Glucose 70 - 99 mg/dL -  BUN 8 - 23 mg/dL -  Creatinine 0.44 - 1.00 mg/dL 1.20(H)  Sodium 135 - 145 mmol/L -  Potassium 3.5 - 5.1 mmol/L -  Chloride 98 - 111 mmol/L -  CO2 22 - 32 mmol/L -  Calcium 8.9 - 10.3 mg/dL -  Total Protein 6.5 - 8.1 g/dL -  Total Bilirubin 0.3 - 1.2 mg/dL -  Alkaline Phos 38 - 126 U/L -  AST 15 - 41 U/L -  ALT 0 - 44 U/L -   CBC Latest Ref Rng & Units 05/26/2020  WBC 4.0 - 10.5 K/uL 12.4(H)  Hemoglobin 12.0 - 15.0 g/dL 12.7  Hematocrit 36.0 - 46.0 % 40.2  Platelets 150 - 400 K/uL 172    No images are attached to the encounter.  CT CHEST ABDOMEN PELVIS WO CONTRAST  Result Date: 02/14/2021 CLINICAL DATA:  Lung mass. EXAM: CT CHEST, ABDOMEN AND PELVIS WITHOUT CONTRAST TECHNIQUE: Multidetector CT imaging of the chest, abdomen and pelvis was performed following the standard protocol without IV contrast. COMPARISON:  Chest CT 10/29/2020.  PET-CT 07/26/2020 FINDINGS: CT CHEST FINDINGS Cardiovascular: The heart size is normal. No substantial pericardial effusion. Coronary artery calcification is evident. Anterior pericardial calcification is stable. Mediastinum/Nodes: No  mediastinal lymphadenopathy. No evidence for gross hilar lymphadenopathy although assessment is limited by the lack of intravenous contrast on the current study. The esophagus has normal imaging features. There is no axillary lymphadenopathy. Lungs/Pleura: Biapical pleuroparenchymal scarring. Stable to minimal interval decrease in left paraspinal abnormal soft tissue extending from approximately T2-T5. Appearance is slightly affected by differential slice thickness between the 2 CT scans (5 mm today versus 2 mm previously). Extension into the neural foramen at T3-4 is similar to prior. No new or progressive soft tissue on today's study. Musculoskeletal: No worrisome lytic or sclerotic osseous abnormality. CT ABDOMEN PELVIS FINDINGS Hepatobiliary: Stable right hepatic lobe cyst with additional tiny liver lesions too small to characterize but likely benign. There is no evidence for gallstones, gallbladder wall thickening, or pericholecystic fluid. No intrahepatic or extrahepatic biliary dilation. Pancreas: No focal mass lesion. No dilatation of the main duct. No intraparenchymal cyst. No peripancreatic edema. Spleen: No splenomegaly. No focal mass lesion. Adrenals/Urinary Tract: Small bilateral adrenal adenomas. Kidneys unremarkable. No evidence for hydroureter. The urinary bladder appears normal for the degree of distention. Stomach/Bowel: Stomach is unremarkable. No gastric wall thickening. No evidence of outlet obstruction. Duodenum is normally positioned as is the ligament of Treitz. No small bowel wall thickening. No small bowel dilatation. The terminal ileum is normal. The appendix is normal. Small lipoma noted mid transverse colon. Vascular/Lymphatic: There is moderate atherosclerotic calcification of the abdominal aorta without aneurysm. There is no gastrohepatic or hepatoduodenal ligament lymphadenopathy. No retroperitoneal or mesenteric lymphadenopathy. No pelvic sidewall lymphadenopathy. Reproductive:  Uterus surgically absent.  There is no adnexal mass. Other: No intraperitoneal free fluid. Musculoskeletal: No worrisome lytic or sclerotic osseous abnormality. IMPRESSION: 1. Stable to minimal interval decrease in left paraspinal abnormal soft tissue extending from approximately T2-T5. Extension into the neural foramen at T3-4 is similar to prior. No new or progressive soft tissue on today's study. 2. No evidence for metastatic disease in the abdomen or pelvis. 3. Aortic Atherosclerosis (ICD10-I70.0). Electronically Signed   By: Misty Stanley M.D.   On: 02/14/2021 07:55     Assessment and plan- Patient is a 80  y.o. female with paravertebral soft tissue/subpleural lung mass here to discuss CT scan results and further management  We have attempted biopsies in the past which have been nondiagnostic and patient does not wish to undergo an open biopsy.  Her most recent CT scan was compared to prior CT from July 2022 as well as PET scan from April 2022 and remains overall stable extending from T2-T5.  No new or progressive findings.  Patient was also noted to have left sacroiliac uptake on prior PET scan in April 2022 which was also attempted to be biopsied and was negative for malignancy and that is not seen on the present CT abdomen anymore.  Plan is to continue to observe this mass without any open biopsies or further treatment.  I will see her back in 6 months with a CT chest without contrast prior.  Patient will let us know if she has any new or worsening neurological symptoms including all but not limited to upper extremity weakness numbness or rib pain   Visit Diagnosis 1. Lung mass      Dr. Randa Evens, MD, MPH [facility] at [facility] [phone] 02/14/2021 2:50 PM

## 2021-02-14 NOTE — Progress Notes (Signed)
Has noticed recent numbness in her left arm and that started with her radiation tx.

## 2021-08-15 ENCOUNTER — Ambulatory Visit
Admission: RE | Admit: 2021-08-15 | Discharge: 2021-08-15 | Disposition: A | Discharge status: Home / Self Care | Payer: Medicare PPO | Source: Ambulatory Visit | Attending: Oncology | Admitting: Oncology

## 2021-08-15 DIAGNOSIS — R918 Other nonspecific abnormal finding of lung field: Secondary | ICD-10-CM | POA: Diagnosis present

## 2021-08-17 ENCOUNTER — Inpatient Hospital Stay: Payer: Medicare PPO | Attending: Oncology | Admitting: Oncology

## 2021-08-17 ENCOUNTER — Encounter: Payer: Self-pay | Admitting: Oncology

## 2021-08-17 VITALS — BP 120/79 | HR 77 | Temp 99.1°F | Resp 18 | Wt 151.6 lb

## 2021-08-17 DIAGNOSIS — R9389 Abnormal findings on diagnostic imaging of other specified body structures: Secondary | ICD-10-CM

## 2021-08-17 DIAGNOSIS — R918 Other nonspecific abnormal finding of lung field: Secondary | ICD-10-CM | POA: Diagnosis not present

## 2021-08-17 NOTE — Progress Notes (Signed)
Pt states that at times she experiences numbness in her left arm and breast and that did not start until her first radiation tx. ?

## 2021-08-21 NOTE — Progress Notes (Signed)
? ? ? ?Hematology/Oncology Consult note ?[facility]  ?Telephone:(336) [phone] [phone] ? ?Patient Care Team: ?Baxter Hire, MD as PCP - General (Internal Medicine) ?Telford Nab, RN as Sales executive  ? ?Name of the patient: Kylie Washington  ?[id number]  ?04/10/1941  ? ?Date of visit: 08/21/21 ? 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 ?Chief complaint/ Reason for visit-discuss CT scan results and further management ? ?Heme/Onc history: Patient is a 81 year old female with a past medical history significant for psoriatic arthritis and hypothyroidism.  She presented with symptoms of Mid back pain which prompted x-rays which led to CT scan.  CT chest without contrast showed subpleural mass in the posterior medial left upper lobe measuring 5.4 x 3.2 x 7.9 cm.  Tumor extends into left T2-T3, T3-T4 and T4-T5 neural foramina.  Mass-effect upon the cord.  Several adjacent satellite nodules identified in the paravertebral left upper lobe.  Bilateral low-attenuation adrenal nodules.  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  Patient is currently on tramadol and uses mainly ibuprofen for her back pain. ?  ?Given the significant back pain and concern for spinal cord involvement patient was referred to radiation oncology and started on steroids.  A week after starting steroids when patient went for a biopsy left upper lobe subpleural mass appeared significantly smaller Measuring 1.8 x 1.5 cm as compared to 6.1 x 2.1 cm prior.  Core biopsy was performed but was not diagnostic of malignancy and showed dense fibrous tissue with mixed inflammatory infiltrate.  Given that the PET CT scan also showed area of hypermetabolism in the soft tissue density adjacent to PSIS a second biopsy of that region was attempted but was again nondiagnostic of malignancy.  While awaiting biopsy results patient had received 2 or 3 radiation treatments  given that the mass was involving spinal cord at T3-T4 level.  She is presently being observed conservatively with repeated scans for the mass.  Patient did not wish to pursue an open biopsy ? ?Interval history-reports doing well overall.  Her back pain has remained stable without any worsening symptoms.  Appetite and weight have remained stable ? ?ECOG PS- 1 ?Pain scale- 0 ? ? ?Review of systems- Review of Systems  ?Constitutional:  Negative for chills, fever, malaise/fatigue and weight loss.  ?HENT:  Negative for congestion, ear discharge and nosebleeds.   ?Eyes:  Negative for blurred vision.  ?Respiratory:  Negative for cough, hemoptysis, sputum production, shortness of breath and wheezing.   ?Cardiovascular:  Negative for chest pain, palpitations, orthopnea and claudication.  ?Gastrointestinal:  Negative for abdominal pain, blood in stool, constipation, diarrhea, heartburn, melena, nausea and vomiting.  ?Genitourinary:  Negative for dysuria, flank pain, frequency, hematuria and urgency.  ?Musculoskeletal:  Negative for back pain, joint pain and myalgias.  ?Skin:  Negative for rash.  ?Neurological:  Negative for dizziness, tingling, focal weakness, seizures, weakness and headaches.  ?Endo/Heme/Allergies:  Does not bruise/bleed easily.  ?Psychiatric/Behavioral:  Negative for depression and suicidal ideas. The patient does not have insomnia.    ? ? ?Allergies  ?Allergen Reactions  ? Aurothioglucose Other (See Comments)  ?  Granulocytopenia  ? Codeine Nausea Only and Nausea And Vomiting  ? Methotrexate Derivatives   ?  Liver enlargement  ? Sulfa Antibiotics Nausea Only  ? ? ? ?Past Medical History:  ?Diagnosis Date  ? Cancer ([place]) 04/22/2020  ? lung   ? Coronary artery disease   ? Hypercholesteremia   ? Hypothyroidism   ?  Psoriasis   ? Psoriatic arthritis ([place])   ? Tooth abscess 05/31/2020  ? ? ? ?Past Surgical History:  ?Procedure Laterality Date  ? [place]  ? CATARACT EXTRACTION, BILATERAL   1997  ? CHOLECYSTECTOMY    ? colonopscopy  2018  ? COLONOSCOPY  2008  ? LIVER BIOPSY  1986  ? STRABISMUS SURGERY Left 1997  ? TONSILLECTOMY  1945  ? ? ?Social History  ? ?Socioeconomic History  ? Marital status: Married  ?  Spouse name: [name]   ? Number of children: 2  ? Years of education: Not on file  ? Highest education level: Not on file  ?Occupational History  ? Occupation: retired   ?  Comment: [employer]   ?Tobacco Use  ? Smoking status: Some Days  ?  Packs/day: 0.25  ?  Years: 49.00  ?  Pack years: 12.25  ?  Types: Cigarettes  ? Smokeless tobacco: Never  ? Tobacco comments:  ?  4 or 5 cigs a day  ?Vaping Use  ? Vaping Use: Never used  ?Substance and Sexual Activity  ? Alcohol use: Not Currently  ? Drug use: Never  ? Sexual activity: Yes  ?Other Topics Concern  ? Not on file  ?Social History Narrative  ? Lives at home with spouse 54 years  ? ?Social Determinants of Health  ? ?Financial Resource Strain: Not on file  ?Food Insecurity: Not on file  ?Transportation Needs: Not on file  ?Physical Activity: Not on file  ?Stress: Not on file  ?Social Connections: Not on file  ?Intimate Partner Violence: Not on file  ? ? ?Family History  ?Problem Relation Age of Onset  ? Diabetes Mother   ? Diabetes Sister   ? Cancer Maternal Uncle   ? Cancer Paternal Uncle   ? ? ? ?Current Outpatient Medications:  ?  Artificial Tear Solution (SOOTHE HYDRATION) 1.25 % SOLN, Apply 1 drop to eye as needed., Disp: , Rfl:  ?  ibuprofen (ADVIL) 200 MG tablet, Take by mouth., Disp: , Rfl:  ?  levothyroxine (SYNTHROID) 88 MCG tablet, TAKE 1 TABLET BY MOUTH ONCE DAILY ON AN EMPTY STOMACH WITH A GLASS OF WATER AT LEAST 30-60 MINUTES BEFORE BREAKFAST, Disp: , Rfl:  ?  vitamin B-12 (CYANOCOBALAMIN) 1000 MCG tablet, Take 1,000 mcg by mouth daily., Disp: , Rfl:  ?  ibuprofen (ADVIL) 400 MG tablet, Take 400 mg by mouth every 6 (six) hours as needed. (Patient not taking: Reported on 11/01/2020), Disp: , Rfl:  ? ?Physical exam:  ?Vitals:  ?  08/17/21 1418  ?BP: 120/79  ?Pulse: 77  ?Resp: 18  ?Temp: 99.1 ?F (37.3 ?C)  ?SpO2: 96%  ?Weight: 151 lb 9.6 oz (68.8 kg)  ? ?Physical Exam ?Constitutional:   ?   General: She is not in acute distress. ?Cardiovascular:  ?   Rate and Rhythm: Normal rate and regular rhythm.  ?   Heart sounds: Normal heart sounds.  ?Pulmonary:  ?   Effort: Pulmonary effort is normal.  ?   Breath sounds: Normal breath sounds.  ?Abdominal:  ?   General: Bowel sounds are normal.  ?   Palpations: Abdomen is soft.  ?Skin: ?   General: Skin is warm and dry.  ?Neurological:  ?   Mental Status: She is alert and oriented to person, place, and time.  ?  ? ? ?  Latest Ref Rng & Units 10/29/2020  ?  2:19 PM  ?CMP  ?Creatinine 0.44 -  1.00 mg/dL 1.20    ? ? ?  Latest Ref Rng & Units 05/26/2020  ?  9:40 AM  ?CBC  ?WBC 4.0 - 10.5 K/uL 12.4    ?Hemoglobin 12.0 - 15.0 g/dL 12.7    ?Hematocrit 36.0 - 46.0 % 40.2    ?Platelets 150 - 400 K/uL 172    ? ? ?No images are attached to the encounter. ? ?CT Chest Wo Contrast ? ?Result Date: 08/16/2021 ?CLINICAL DATA:  Left lung mass, follow-up. * Tracking Code: BO * EXAM: CT CHEST WITHOUT CONTRAST TECHNIQUE: Multidetector CT imaging of the chest was performed following the standard protocol without IV contrast. RADIATION DOSE REDUCTION: This exam was performed according to the departmental dose-optimization program which includes automated exposure control, adjustment of the mA and/or kV according to patient size and/or use of iterative reconstruction technique. 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 Anterior pericardial effusion and calcification is similar to prior examinations. Normal size heart. Coronary artery calcifications. Mediastinum/Nodes: No supraclavicular adenopathy. No discrete thyroid nodule. No pathologically enlarged  mediastinal, hilar or axillary lymph nodes, noting limited sensitivity for the detection of hilar adenopathy on this noncontrast study. Small hiatal hernia with mild diffuse esophageal wall thickening may refle

## 2022-01-21 IMAGING — MR MR HEAD WO/W CM
14 series · 48 of 48 positions shown · IV contrast (gadavist)
Comparison: None.

CLINICAL DATA: Metastatic workup.  Lung mass.

EXAM:
MRI HEAD WITHOUT AND WITH CONTRAST
TECHNIQUE: Multiplanar, multiecho pulse sequences of the brain and surrounding
structures were obtained without and with intravenous contrast.
CONTRAST:  7mL GADAVIST GADOBUTROL 1 MMOL/ML IV SOLN

[Series 5: ax dwi_tracew · axial · 3.0mm · 0.71mm/px · z∈[+12,+173]mm · 4 of 56 slices shown]
[im 1/56]
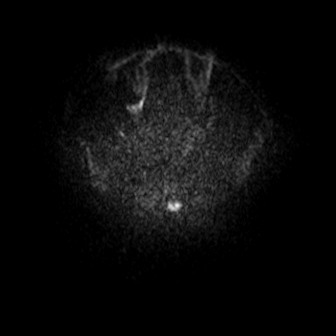
[im 19/56]
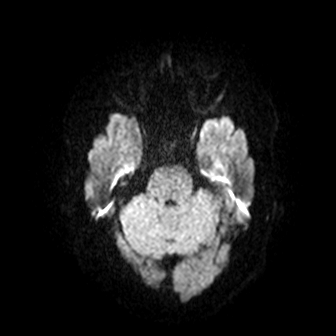
[im 37/56]
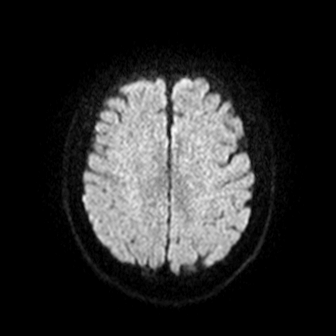
[im 56/56]
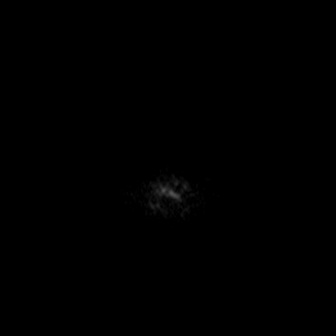

[Series 6: ax dwi_adc · axial · 3.0mm · 0.71mm/px · z∈[+12,+173]mm · 3 of 56 slices shown]
[im 1/56]
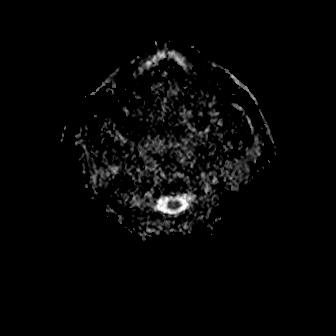
[im 28/56]
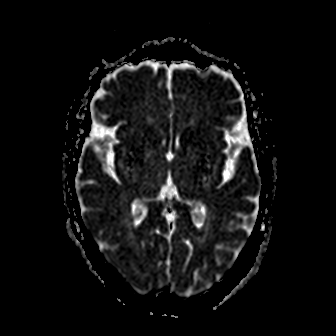
[im 56/56]
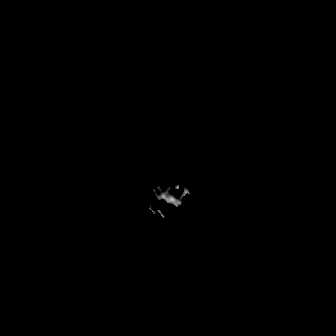

[Series 7: cor dwi_tracew · coronal · 5.0mm · 0.68mm/px · 2 of 40 slices shown]
[im 1/40]
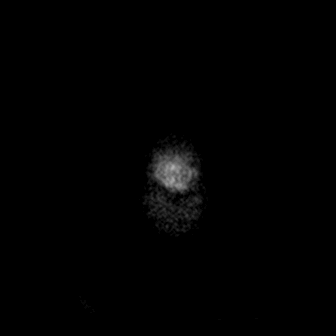
[im 40/40]
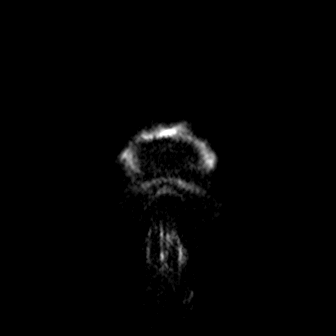

[Series 8: cor dwi_adc · coronal · 5.0mm · 0.68mm/px · 2 of 40 slices shown]
[im 1/40]
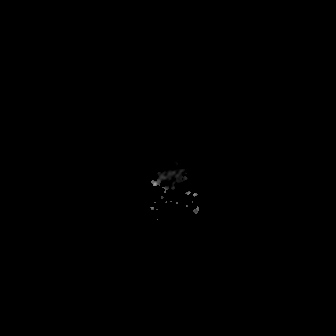
[im 40/40]
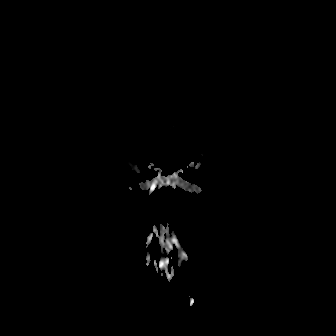

[Series 9: T1 · sagittal · 5.0mm · 0.62mm/px · 1 of 25 slices shown (1 of 2)]
[im 1/25]
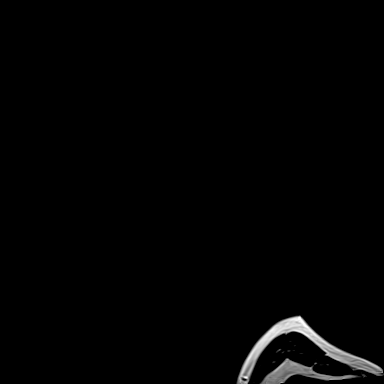

[Series 10: T2 · axial · 5.0mm · 0.53mm/px · z∈[+16,+168]mm · 2 of 27 slices shown]
[im 1/27]
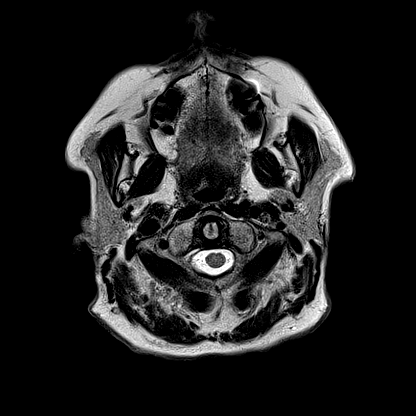
[im 27/27]
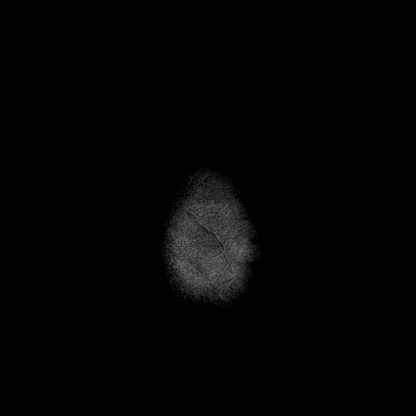

[Series 12: pha_images · axial · 3.0mm · 0.90mm/px · z∈[+22,+168]mm · 3 of 51 slices shown]
[im 1/51]
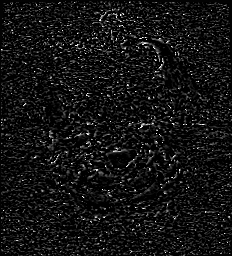
[im 26/51]
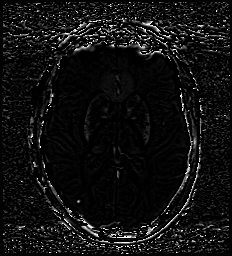
[im 51/51]
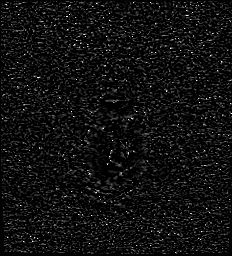

[Series 13: swi_images · axial · 3.0mm · 0.90mm/px · z∈[+19,+168]mm · 3 of 52 slices shown]
[im 1/52]
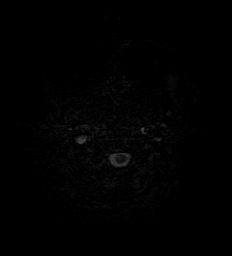
[im 26/52]
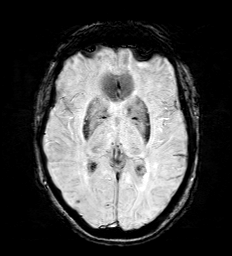
[im 52/52]
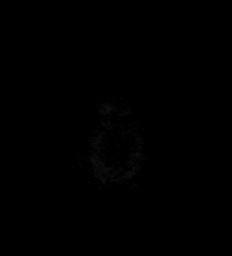

[Series 15: FLAIR · axial · 3.0mm · 0.69mm/px · z∈[+13,+171]mm · 3 of 55 slices shown]
[im 1/55]
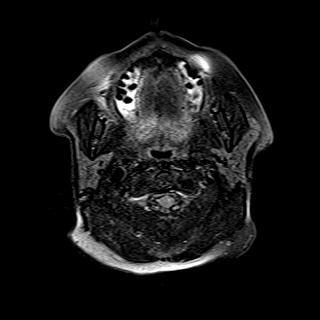
[im 28/55]
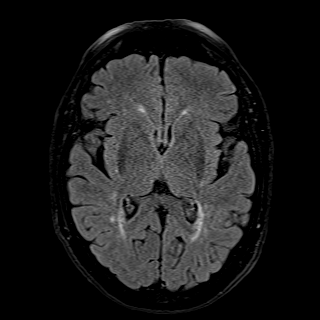
[im 55/55]
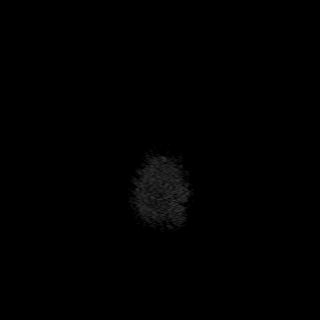

[Series 16: T1 · axial · 1.0mm · 0.98mm/px · z∈[+13,+183]mm · 10 of 176 slices shown (2 of 2)]
[im 1/176]
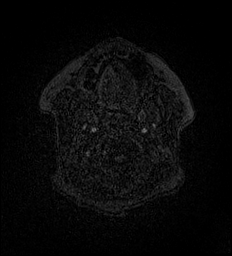
[im 20/176]
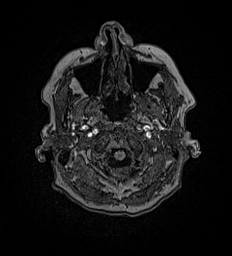
[im 39/176]
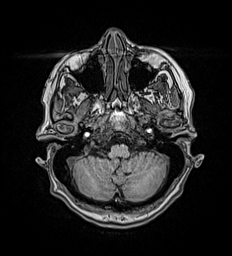
[im 59/176]
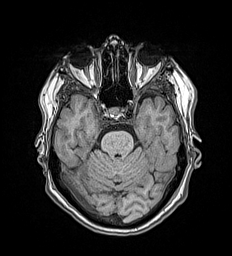
[im 78/176]
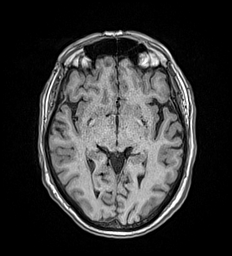
[im 98/176]
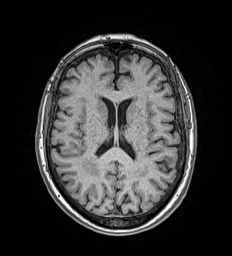
[im 117/176]
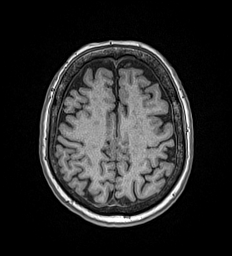
[im 137/176]
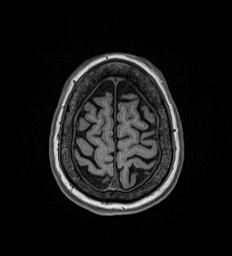
[im 156/176]
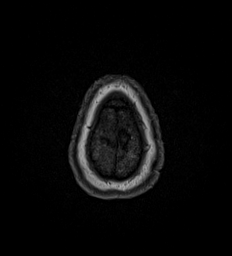
[im 176/176]
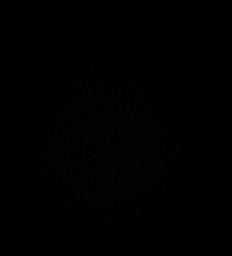

[Series 17: T2 post-contrast · coronal · 5.0mm · 0.57mm/px · 2 of 29 slices shown]
[im 1/29]
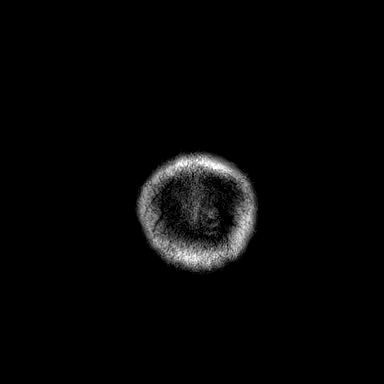
[im 29/29]
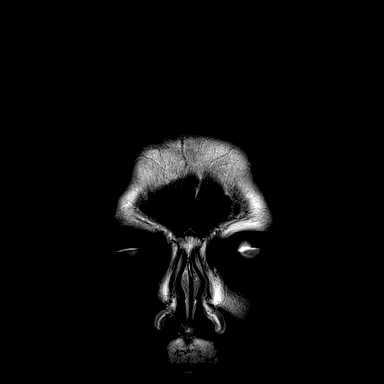

[Series 18: T1 post-contrast · axial · 1.0mm · 0.98mm/px · z∈[+13,+183]mm · 10 of 176 slices shown (1 of 3)]
[im 1/176]
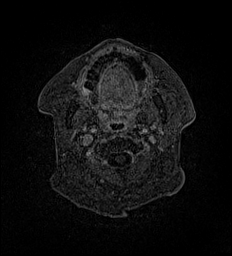
[im 20/176]
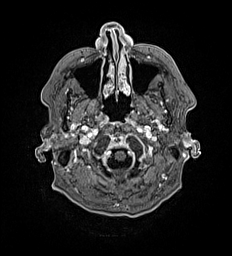
[im 39/176]
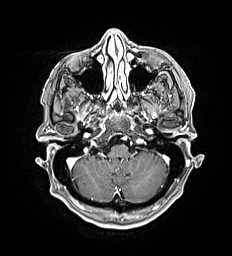
[im 59/176]
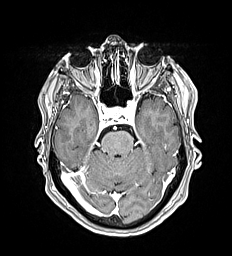
[im 78/176]
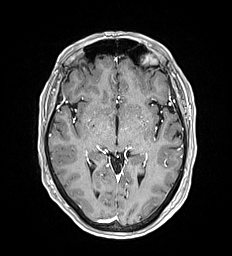
[im 98/176]
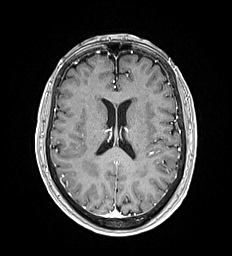
[im 117/176]
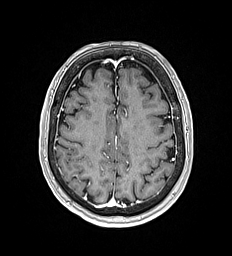
[im 137/176]
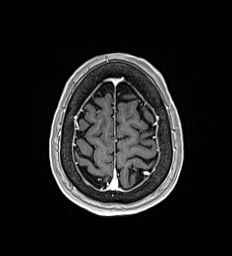
[im 156/176]
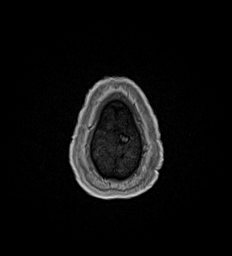
[im 176/176]
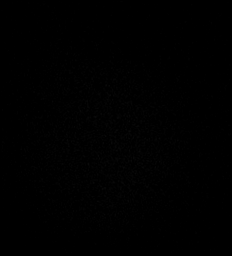

[Series 19: T1 post-contrast · coronal · 5.0mm · 0.57mm/px · 2 of 29 slices shown (2 of 3)]
[im 1/29]
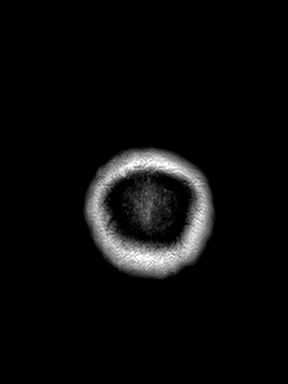
[im 29/29]
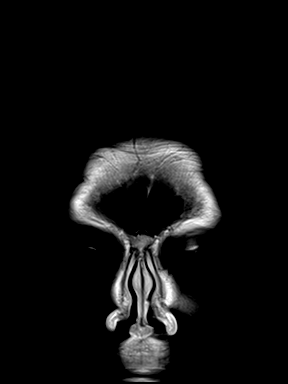

[Series 20: T1 post-contrast · sagittal · 5.0mm · 0.62mm/px · 1 of 25 slices shown (3 of 3)]
[im 1/25]
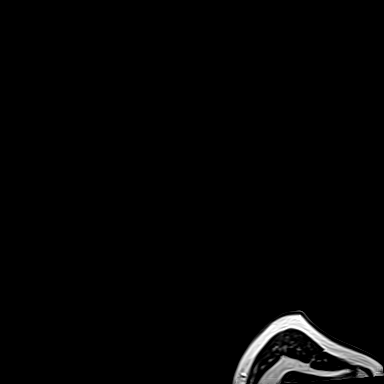

[48 of 48 positions shown; findings below may reference images not displayed]

FINDINGS: Brain: No enhancement or swelling to suggest metastatic disease. No
incidental infarct, hemorrhage, hydrocephalus, or collection. Age
normal brain volume. Mild to moderate for age chronic small vessel
ischemia in the deep cerebral white matter.

Vascular: Normal flow voids and vascular enhancements.

Skull and upper cervical spine: No noted metastatic disease.
Degenerative facet spurring.

Sinuses/Orbits: Symmetric prominent extraocular muscle thickness
without discrete mass. The lateral rectus and obliques appear
spared. No proptosis. Bilateral cataract resection.
IMPRESSION: 1. No evidence of metastatic disease to the brain.
2. Prominent extraocular muscle thickness suggesting thyroid
orbitopathy.

## 2022-02-17 ENCOUNTER — Ambulatory Visit
Admission: RE | Admit: 2022-02-17 | Discharge: 2022-02-17 | Disposition: A | Discharge status: Home / Self Care | Payer: Medicare PPO | Source: Ambulatory Visit | Attending: Oncology | Admitting: Oncology

## 2022-02-17 DIAGNOSIS — R918 Other nonspecific abnormal finding of lung field: Secondary | ICD-10-CM | POA: Insufficient documentation

## 2022-02-20 ENCOUNTER — Inpatient Hospital Stay: Payer: Medicare PPO | Attending: Oncology | Admitting: Oncology

## 2022-02-20 ENCOUNTER — Encounter: Payer: Self-pay | Admitting: Oncology

## 2022-02-20 VITALS — BP 144/116 | HR 81 | Temp 98.0°F | Resp 16 | Wt 151.7 lb

## 2022-02-20 DIAGNOSIS — M549 Dorsalgia, unspecified: Secondary | ICD-10-CM | POA: Diagnosis not present

## 2022-02-20 DIAGNOSIS — R918 Other nonspecific abnormal finding of lung field: Secondary | ICD-10-CM | POA: Diagnosis present

## 2022-02-20 DIAGNOSIS — Z85828 Personal history of other malignant neoplasm of skin: Secondary | ICD-10-CM | POA: Diagnosis not present

## 2022-02-20 DIAGNOSIS — Z720 Tobacco use: Secondary | ICD-10-CM | POA: Insufficient documentation

## 2022-03-05 NOTE — Progress Notes (Signed)
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  She presented with symptoms of Mid back pain which prompted x-rays which led to CT scan.  CT chest without contrast showed subpleural mass in the posterior medial left upper lobe measuring 5.4 x 3.2 x 7.9 cm.  Tumor extends into left T2-T3, T3-T4 and T4-T5 neural foramina.  Mass-effect upon the cord.  Several adjacent satellite nodules identified in the paravertebral left upper lobe.  Bilateral low-attenuation adrenal nodules.  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  Patient is currently on tramadol and uses mainly ibuprofen for her back pain.   Given the significant back pain and concern for spinal cord involvement patient was referred to radiation oncology and started on steroids.  A week after starting steroids when patient went for a biopsy left upper lobe subpleural mass appeared significantly smaller Measuring 1.8 x 1.5 cm as compared to 6.1 x 2.1 cm prior.  Core biopsy was performed but was not diagnostic of malignancy and showed dense fibrous tissue with mixed inflammatory infiltrate.  Given that the PET CT scan also showed area of hypermetabolism in the soft tissue density adjacent to PSIS a second biopsy of that region was attempted but was again nondiagnostic of malignancy.  While awaiting biopsy results patient had received 2 or 3 radiation treatments  given that the mass was involving spinal cord at T3-T4 level.  She is presently being observed conservatively with repeated scans for the mass.  Patient did not wish to pursue an open biopsy    Interval history-patient reports occasional numbness in her left arm and chest wall area which she states has persisted on and off ever since she received 1 dose of radiation treatment over a year ago.  No new complaints at this time  ECOG PS- 1 Pain scale- 0   Review of systems- Review of Systems  Constitutional:  Negative for chills, fever, malaise/fatigue and weight loss.  HENT:  Negative for congestion, ear discharge and nosebleeds.   Eyes:  Negative for blurred vision.  Respiratory:  Negative for cough, hemoptysis, sputum production, shortness of breath and wheezing.   Cardiovascular:  Negative for chest pain, palpitations, orthopnea and claudication.  Gastrointestinal:  Negative for abdominal pain, blood in stool, constipation, diarrhea, heartburn, melena, nausea and vomiting.  Genitourinary:  Negative for dysuria, flank pain, frequency, hematuria and urgency.  Musculoskeletal:  Negative for back pain, joint pain and myalgias.  Skin:  Negative for rash.  Neurological:  Negative for dizziness, tingling, focal weakness, seizures, weakness and headaches.  Endo/Heme/Allergies:  Does not bruise/bleed easily.  Psychiatric/Behavioral:  Negative for depression and suicidal ideas. The patient does not have insomnia.       Allergies  Allergen Reactions   Aurothioglucose Other (See Comments)    Granulocytopenia   Codeine Nausea Only and Nausea And Vomiting   Methotrexate Derivatives     Liver enlargement   Sulfa Antibiotics Nausea Only     Past Medical History:  Diagnosis Date  Cancer ([place]) 04/22/2020   lung    Coronary artery disease    Hypercholesteremia    Hypothyroidism    Psoriasis    Psoriatic arthritis ([place])    Skin cancer    about 3 weeks she has cream to slough the cancer   off her left cheek   Tooth abscess 05/31/2020     Past Surgical History:  Procedure Laterality Date   CARPAL TUNNEL RELEASE  1988   CATARACT EXTRACTION, BILATERAL  1997   CHOLECYSTECTOMY     colonopscopy  2018   COLONOSCOPY  2008   LIVER BIOPSY  1986   STRABISMUS SURGERY Left 1997   TONSILLECTOMY  1945    Social History   Socioeconomic History   Marital status: Married    Spouse name: John    Number of children: 2   Years of education: Not on file   Highest education level: Not on file  Occupational History   Occupation: retired     Comment: [employer]   Tobacco Use   Smoking status: Some Days    Packs/day: 0.25    Years: 49.00    Total pack years: 12.25    Types: Cigarettes   Smokeless tobacco: Never   Tobacco comments:    4 or 5 cigs a day  Vaping Use   Vaping Use: Never used  Substance and Sexual Activity   Alcohol use: Not Currently   Drug use: Never   Sexual activity: Yes  Other Topics Concern   Not on file  Social History Narrative   Lives at home with spouse 69 years   Social Determinants of Health   Financial Resource Strain: Not on file  Food Insecurity: Not on file  Transportation Needs: Not on file  Physical Activity: Not on file  Stress: Not on file  Social Connections: Not on file  Intimate Partner Violence: Not on file    Family History  Problem Relation Age of Onset   Diabetes Mother    Diabetes Sister    Cancer Maternal Uncle    Cancer Paternal Uncle      Current Outpatient Medications:    Artificial Tear Solution (SOOTHE HYDRATION) 1.25 % SOLN, Apply 1 drop to eye as needed., Disp: , Rfl:    ibuprofen (ADVIL) 200 MG tablet, Take 200 mg by mouth every 6 (six) hours as needed., Disp: , Rfl:    levothyroxine (SYNTHROID) 88 MCG tablet, TAKE 1 TABLET BY MOUTH ONCE DAILY ON AN EMPTY STOMACH WITH A GLASS OF WATER AT LEAST 30-60 MINUTES BEFORE BREAKFAST, Disp: , Rfl:    vitamin B-12 (CYANOCOBALAMIN) 1000 MCG tablet, Take 1,000 mcg  by mouth daily., Disp: , Rfl:   Physical exam:  Vitals:   02/20/22 1332  BP: (!) 144/116  Pulse: 81  Resp: 16  Temp: 98 F (36.7 C)  TempSrc: Oral  Weight: 151 lb 11.2 oz (68.8 kg)   Physical Exam Cardiovascular:     Rate and Rhythm: Normal rate and regular rhythm.     Heart sounds: Normal heart sounds.  Pulmonary:     Effort: Pulmonary effort is normal.     Breath sounds: Normal breath sounds.  Abdominal:     General: Bowel sounds are normal.     Palpations: Abdomen is soft.  Skin:    General: Skin is warm and dry.  Neurological:     Mental Status: She is alert and oriented to person, place, and time.     Motor: No weakness.  Latest Ref Rng & Units 10/29/2020    2:19 PM  CMP  Creatinine 0.44 - 1.00 mg/dL 1.20       Latest Ref Rng & Units 05/26/2020    9:40 AM  CBC  WBC 4.0 - 10.5 K/uL 12.4   Hemoglobin 12.0 - 15.0 g/dL 12.7   Hematocrit 36.0 - 46.0 % 40.2   Platelets 150 - 400 K/uL 172     No images are attached to the encounter.  CT Chest Wo Contrast  Result Date: 02/20/2022 CLINICAL DATA:  81 year old female presents for evaluation of paravertebral mass on prior CT imaging. * Tracking Code: BO * EXAM: CT CHEST WITHOUT CONTRAST TECHNIQUE: Multidetector CT imaging of the chest was performed following the standard protocol without IV contrast. RADIATION DOSE REDUCTION: This exam was performed according to the departmental dose-optimization program which includes automated exposure control, adjustment of the mA and/or kV according to patient size and/or use of iterative reconstruction technique. COMPARISON:  Aug 15, 2021. FINDINGS: Cardiovascular: Calcified aortic atherosclerosis. 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 Calcifications along the pericardium are unchanged. No pericardial effusion. Heart size is normal. Central pulmonary vasculature is of normal caliber. Limited assessment of cardiovascular structures given lack of intravenous contrast.  Mediastinum/Nodes: No signs of adenopathy in the chest with scattered small lymph nodes in the mediastinum that show no change. Esophagus grossly normal by CT. Lungs/Pleura: No signs of effusion or evidence of consolidative changes. No sign of parenchymal mass in the chest. Pleural and parenchymal scarring adjacent to paravertebral soft tissue that is been noted on prior studies. These findings show no change. Airways are patent. Upper Abdomen: Incidental imaging of upper abdominal contents without acute findings. Imaged portions the liver, gallbladder, pancreas, spleen, adrenal glands and kidneys without acute process. Stable bilateral adrenal adenomata for which no additional dedicated imaging follow-up is recommended, largest on the LEFT measuring 15 mm. Density of 3 Hounsfield units. Musculoskeletal: No acute bone finding. No destructive bone process. Spinal degenerative changes. Soft tissue along the LEFT paraspinous region best seen on sagittal reconstructed images (image 89/7 when measured in terms of sagittal thickness to the ventral surface of adjacent ribs at 15 mm as compared to 13 mm. Involvement of neural foramina particularly at the T3-4 level with extension into the central canal and mild encroachment into the central canal with similar appearance otherwise. Lesion spans T2 through T5. When measured in the axial plane greatest thickness approximately 12 as compared to 10 mm. IMPRESSION: 1. Soft tissue along the LEFT paraspinous region with infiltrative margins appears very slightly increased in greatest thickness on both axial and sagittal planes and continues to involve neural foramina along the LEFT lateral aspect of the T2 through T5 levels. Given that this is primarily a paraspinal and extrapleural process future follow-up with thoracic spine MRI may be beneficial to assess full extent of disease and may provide a more accurate means of follow-up to assess for any interval changes. 2. Stable  bilateral adrenal adenomata for which no additional dedicated imaging follow-up is recommended, largest on the LEFT measuring 15 mm. Based on current clinical literature, biochemical evaluation to exclude possible functioning adrenal nodule is suggested if not already performed. Please refer to current clinical guidelines for detailed recommendations. NEJM 8341:962 154-51 3. 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 Recommend annual imaging followup by CTA or MRA. This recommendation follows 2010 ACCF/AHA/AATS/ACR/ASA/SCA/SCAI/SIR/STS/SVM Guidelines for the Diagnosis and Management of Patients with Thoracic Aortic Disease. Circulation. 2010; 121: I297-L892. Aortic aneurysm NOS (  ICD10-I71.9) 4. Aortic atherosclerosis. Aortic Atherosclerosis (ICD10-I70.0). Electronically Signed   By: Zetta Bills M.D.   On: 02/20/2022 13:58     Assessment and plan- Patient is a 81 y.o. female here for routine follow-up of paravertebral mass noted on CT scan  I have reviewed CTChest images independently and discussed findings with the patient which does not show significant change in the size of the paravertebral mass although on the report it appears to be slightly increased.  Biopsy x 2 have been negative for malignancy and the mass has overall remained stable since February 2022.  Radiology at present recommends future follow-ups with thoracic spine MRI instead of a CT chest.  I will therefore see her back in 6 months with an MRI of the thoracic spine for further management.  If there is any change in the size of the mass we will plan for a biopsy at that time   Visit Diagnosis 1. Lung mass      Dr. Randa Evens, MD, MPH [facility] at [facility] [phone] 03/05/2022 5:25 PM

## 2022-03-23 ENCOUNTER — Other Ambulatory Visit: Payer: Self-pay | Admitting: *Deleted

## 2022-03-23 DIAGNOSIS — R918 Other nonspecific abnormal finding of lung field: Secondary | ICD-10-CM

## 2022-03-23 DIAGNOSIS — R9389 Abnormal findings on diagnostic imaging of other specified body structures: Secondary | ICD-10-CM

## 2022-05-31 ENCOUNTER — Other Ambulatory Visit: Payer: Self-pay | Admitting: Physician Assistant

## 2022-05-31 ENCOUNTER — Ambulatory Visit
Admission: RE | Admit: 2022-05-31 | Discharge: 2022-05-31 | Disposition: A | Discharge status: Home / Self Care | Payer: Medicare PPO | Source: Ambulatory Visit | Attending: Physician Assistant | Admitting: Physician Assistant

## 2022-05-31 DIAGNOSIS — S32010A Wedge compression fracture of first lumbar vertebra, initial encounter for closed fracture: Secondary | ICD-10-CM

## 2022-06-02 ENCOUNTER — Other Ambulatory Visit: Payer: Self-pay | Admitting: Physician Assistant

## 2022-06-02 DIAGNOSIS — S32010A Wedge compression fracture of first lumbar vertebra, initial encounter for closed fracture: Secondary | ICD-10-CM

## 2022-06-07 ENCOUNTER — Inpatient Hospital Stay
Admission: RE | Admit: 2022-06-07 | Discharge: 2022-06-07 | Disposition: A | Discharge status: Home / Self Care | Payer: Medicare PPO | Source: Ambulatory Visit | Attending: Physician Assistant | Admitting: Physician Assistant

## 2022-06-28 ENCOUNTER — Encounter: Payer: Self-pay | Admitting: Hospice and Palliative Medicine

## 2022-06-28 ENCOUNTER — Ambulatory Visit
Admission: RE | Admit: 2022-06-28 | Discharge: 2022-06-28 | Disposition: A | Discharge status: Home / Self Care | Payer: Medicare PPO | Source: Ambulatory Visit | Attending: Hospice and Palliative Medicine | Admitting: Hospice and Palliative Medicine

## 2022-06-28 ENCOUNTER — Telehealth: Payer: Self-pay | Admitting: *Deleted

## 2022-06-28 ENCOUNTER — Other Ambulatory Visit: Payer: Self-pay

## 2022-06-28 ENCOUNTER — Inpatient Hospital Stay: Payer: Medicare PPO | Attending: Hospice and Palliative Medicine | Admitting: Hospice and Palliative Medicine

## 2022-06-28 ENCOUNTER — Telehealth: Payer: Self-pay | Admitting: Oncology

## 2022-06-28 ENCOUNTER — Ambulatory Visit
Admission: RE | Admit: 2022-06-28 | Discharge: 2022-06-28 | Disposition: A | Discharge status: Home / Self Care | Payer: Medicare PPO | Source: Ambulatory Visit | Attending: Oncology | Admitting: Oncology

## 2022-06-28 ENCOUNTER — Ambulatory Visit
Admission: RE | Admit: 2022-06-28 | Discharge: 2022-06-28 | Disposition: A | Discharge status: Home / Self Care | Payer: Medicare PPO | Source: Home / Self Care | Attending: Hospice and Palliative Medicine | Admitting: Hospice and Palliative Medicine

## 2022-06-28 ENCOUNTER — Other Ambulatory Visit: Payer: Self-pay | Admitting: *Deleted

## 2022-06-28 VITALS — BP 158/92 | HR 88 | Temp 98.0°F | Resp 20 | Ht 66.5 in | Wt 145.0 lb

## 2022-06-28 DIAGNOSIS — E7800 Pure hypercholesterolemia, unspecified: Secondary | ICD-10-CM | POA: Insufficient documentation

## 2022-06-28 DIAGNOSIS — L4050 Arthropathic psoriasis, unspecified: Secondary | ICD-10-CM | POA: Diagnosis not present

## 2022-06-28 DIAGNOSIS — R918 Other nonspecific abnormal finding of lung field: Secondary | ICD-10-CM

## 2022-06-28 DIAGNOSIS — I2510 Atherosclerotic heart disease of native coronary artery without angina pectoris: Secondary | ICD-10-CM | POA: Diagnosis not present

## 2022-06-28 DIAGNOSIS — Z85828 Personal history of other malignant neoplasm of skin: Secondary | ICD-10-CM | POA: Insufficient documentation

## 2022-06-28 DIAGNOSIS — M4317 Spondylolisthesis, lumbosacral region: Secondary | ICD-10-CM | POA: Insufficient documentation

## 2022-06-28 DIAGNOSIS — Z79890 Hormone replacement therapy (postmenopausal): Secondary | ICD-10-CM | POA: Diagnosis not present

## 2022-06-28 DIAGNOSIS — E039 Hypothyroidism, unspecified: Secondary | ICD-10-CM | POA: Insufficient documentation

## 2022-06-28 DIAGNOSIS — M549 Dorsalgia, unspecified: Secondary | ICD-10-CM

## 2022-06-28 DIAGNOSIS — M47816 Spondylosis without myelopathy or radiculopathy, lumbar region: Secondary | ICD-10-CM | POA: Insufficient documentation

## 2022-06-28 DIAGNOSIS — M546 Pain in thoracic spine: Secondary | ICD-10-CM

## 2022-06-28 DIAGNOSIS — R079 Chest pain, unspecified: Secondary | ICD-10-CM

## 2022-06-28 DIAGNOSIS — M47817 Spondylosis without myelopathy or radiculopathy, lumbosacral region: Secondary | ICD-10-CM | POA: Insufficient documentation

## 2022-06-28 DIAGNOSIS — M5136 Other intervertebral disc degeneration, lumbar region: Secondary | ICD-10-CM | POA: Diagnosis not present

## 2022-06-28 DIAGNOSIS — M4316 Spondylolisthesis, lumbar region: Secondary | ICD-10-CM | POA: Insufficient documentation

## 2022-06-28 MED ORDER — GADOBUTROL 1 MMOL/ML IV SOLN
6.0000 mL | Freq: Once | INTRAVENOUS | Status: AC | PRN
  Administered 2022-06-28: 6 mL via INTRAVENOUS

## 2022-06-28 NOTE — Telephone Encounter (Signed)
Pt daughter called and stated that pt is having sever back pain where her mass is. She requested to moved May appts up to now and to be assessed by Dr. Janese Banks.

## 2022-06-28 NOTE — Telephone Encounter (Signed)
Attempted to reach patient to discuss chest xray results. No answer. Vm left requesting a return phone call. Spoke with daughter Kylie Washington. pt is with daughter Kylie Washington at [place] (phone [phone]). I attempted to reach this daughter but did not leave any specific informaiton other than attempting to reach Ms. Baluyot to discuss test results.  I will try again in the morning.   Also of note- pt needs to update her DPR to include her daughter's information.

## 2022-06-28 NOTE — Progress Notes (Signed)
History of compression fracture in Feb 18th s/p fall. Pt has noted an increase in back pain that radiates to her left shoulder in to the left side of her chest.  Patient's husband also passed away this week and planning his funeral for this coming Sat.

## 2022-06-28 NOTE — Progress Notes (Signed)
Symptom Management [place] at [facility] [phone] [phone]  Patient Care Team: Baxter Hire, MD as PCP - General (Internal Medicine) Telford Nab, RN as Oncology Nurse Navigator   NAME OF PATIENT: Kylie Washington  [id number]  Mar 28, 1941   DATE OF VISIT: 06/28/22  REASON FOR CONSULT: Kylie Washington is a 82 y.o. 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  Patient has had history of mid back pain leading to CT scans showing a subpleural mass with tumor extension into the left T2-T3, T3-T4, and T4-T5 neural foramina with mass effect.  Patient was started on steroids with significant shrinkage in her mass.  Core biopsy was attempted but nondiagnostic.   INTERVAL HISTORY: Patient last saw Dr. Janese Washington on 02/20/2022 at which time plan was for surveillance with repeat MRI of thoracic spine in 6 months.  Patient had a fall February 18 with worsening back pain.  She subsequently saw Ortho with MRI of the lumbar spine on 05/31/2022 revealing superior endplate fracture of L1, facet osteoarthritis at L4-L5 and L5-S1 with mild bulging of the discs, and L3-L4 disc bulge but no neural compression.  Patient presents to clinic today with worsening back pain.  Patient reports that she has left scapular pain, which radiates around the axilla and chest.  She says that this pain is similar to when her mass was first found.  She has tried Tylenol and ibuprofen with little improvement in pain.  Pain is interfering with her ability to sleep.  Patient reports that she was prescribed tramadol by Ortho but has not yet taken any.  She denies any weakness in either extremity.  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  Patient says she had the flu in January and has had a persistent cough, worse at night.  Denies fever or chills.  No  shortness of breath.  Patient also reports that she is being followed by dermatology with steroid injections to a face lesion.  Denies recent fevers or illnesses. Denies any easy bleeding or bruising. Reports good appetite and denies weight loss. Denies chest pain. Denies any nausea, vomiting, constipation, or diarrhea. Denies urinary complaints. Patient offers no further specific complaints today.   PAST MEDICAL HISTORY: Past Medical History:  Diagnosis Date   Cancer ([place]) 04/22/2020   lung    Coronary artery disease    Hypercholesteremia    Hypothyroidism    Psoriasis    Psoriatic arthritis ([place])    Skin cancer    about 3 weeks she has cream to slough the cancer  off her left cheek   Tooth abscess 05/31/2020    PAST SURGICAL HISTORY:  Past Surgical History:  Procedure Laterality Date   CARPAL TUNNEL RELEASE  1988   CATARACT EXTRACTION, BILATERAL  1997   CHOLECYSTECTOMY     colonopscopy  2018   COLONOSCOPY  2008   LIVER BIOPSY  1986   STRABISMUS SURGERY Left 1997   TONSILLECTOMY  1945    HEMATOLOGY/ONCOLOGY HISTORY:  Oncology History   No history exists.    ALLERGIES:  is allergic to aurothioglucose, codeine, methotrexate derivatives, and sulfa antibiotics.  MEDICATIONS:  Current Outpatient Medications  Medication Sig Dispense Refill   ibuprofen (ADVIL) 200 MG tablet Take 200 mg by mouth every 6 (six) hours as needed.     vitamin B-12 (CYANOCOBALAMIN) 1000 MCG tablet Take 1,000 mcg by mouth daily.  Artificial Tear Solution (SOOTHE HYDRATION) 1.25 % SOLN Apply 1 drop to eye as needed. (Patient not taking: Reported on 06/28/2022)     levothyroxine (SYNTHROID) 88 MCG tablet TAKE 1 TABLET BY MOUTH ONCE DAILY ON AN EMPTY STOMACH WITH A GLASS OF WATER AT LEAST 30-60 MINUTES BEFORE BREAKFAST     traMADol (ULTRAM) 50 MG tablet Take 50 mg by mouth every 6 (six) hours as needed. (Patient not taking: Reported on 06/28/2022)     No current facility-administered medications  for this visit.    VITAL SIGNS: BP (!) 158/92   Pulse 88   Temp 98 F (36.7 C) (Tympanic)   Resp 20   Ht 5' 6.5" (1.689 m)   Wt 145 lb (65.8 kg)   BMI 23.05 kg/m  Filed Weights   06/28/22 0943  Weight: 145 lb (65.8 kg)    Estimated body mass index is 23.05 kg/m as calculated from the following:   Height as of this encounter: 5' 6.5" (1.689 m).   Weight as of this encounter: 145 lb (65.8 kg).  LABS: CBC:    Component Value Date/Time   WBC 12.4 (H) 05/26/2020 0940   HGB 12.7 05/26/2020 0940   HCT 40.2 05/26/2020 0940   PLT 172 05/26/2020 0940   MCV 88.2 05/26/2020 0940   NEUTROABS 9.0 (H) 05/03/2020 1443   LYMPHSABS 1.8 05/03/2020 1443   MONOABS 0.8 05/03/2020 1443   EOSABS 0.5 05/03/2020 1443   BASOSABS 0.1 05/03/2020 1443   Comprehensive Metabolic Panel:    Component Value Date/Time   NA 139 05/03/2020 1443   K 4.4 05/03/2020 1443   CL 102 05/03/2020 1443   CO2 27 05/03/2020 1443   BUN 15 05/03/2020 1443   CREATININE 1.20 (H) 10/29/2020 1419   GLUCOSE 111 (H) 05/03/2020 1443   CALCIUM 9.0 05/03/2020 1443   AST 14 (L) 05/03/2020 1443   ALT 11 05/03/2020 1443   ALKPHOS 92 05/03/2020 1443   BILITOT 0.5 05/03/2020 1443   PROT 8.0 05/03/2020 1443   ALBUMIN 3.6 05/03/2020 1443    RADIOGRAPHIC STUDIES: MR LUMBAR SPINE WO CONTRAST  Result Date: 05/31/2022 CLINICAL DATA:  [place] 1 week ago.  Low back pain worse on the right. EXAM: MRI LUMBAR SPINE WITHOUT CONTRAST TECHNIQUE: Multiplanar, multisequence MR imaging of the lumbar spine was performed. No intravenous contrast was administered. COMPARISON:  Thoracic MRI 05/09/2020 FINDINGS: Segmentation:  5 lumbar type vertebral bodies. Alignment: Normal except for 1-2 mm of anterolisthesis at L4-5 and 2 mm of anterolisthesis at L5-S1 on the basis of degenerative facet disease. Vertebrae: Acute superior endplate fracture at L1 with loss of height of only 10%. No retropulsed bone. No traumatic disc herniation. No other acute  bone finding. Benign appearing hemangioma within the right sacral ala. Conus medullaris and cauda equina: Conus extends to the T12-L1 level. Conus and cauda equina appear normal. Paraspinal and other soft tissues: Negative Disc levels: No significant disc level finding at L2-3 or above. L3-4: Mild bulging of the disc. Mild narrowing of the lateral recesses but no likely neural compression. L4-5: Facet osteoarthritis with 1-2 mm of anterolisthesis. Bulging of the disc. Mild narrowing of the lateral recesses but no likely neural compression. Facet arthritis could be painful. L5-S1: Bilateral facet osteoarthritis with 2 mm of anterolisthesis. Mild bulging of the disc. No compressive stenosis. The facet arthritis could be painful. IMPRESSION: 1. Acute superior endplate fracture at L1 with loss of height of only 10%. No retropulsed bone or traumatic disc herniation. This  could certainly be painful. 2. 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 Mild bulging of the discs. No compressive stenosis. The facet arthritis could be painful. 3. L3-4: Disc bulge. Mild narrowing of the lateral recesses but no likely neural compression. Electronically Signed   By: Nelson Chimes M.D.   On: 05/31/2022 16:37    PERFORMANCE STATUS (ECOG) : 1 - Symptomatic but completely ambulatory  Review of Systems Unless otherwise noted, a complete review of systems is negative.  Physical Exam General: NAD Cardiovascular: regular rate and rhythm Pulmonary: clear ant fields Abdomen: soft, nontender, + bowel sounds GU: no suprapubic tenderness Extremities: no edema, no joint deformities Skin: no rashes Neurological: Weakness but otherwise nonfocal  IMPRESSION/PLAN: Back pain -patient does have some tenderness to palpation to the soft tissue adjacent to the thoracic spine.  Discussed with Dr. Janese Washington and will obtain repeat thoracic MRI for further evaluation.  Patient has tramadol available at home  and agreed to take that as needed for pain.    Cough -lungs clear to auscultation.  This is likely postviral cough from her previous flu.  Will obtain chest x-ray for evaluation.  Case and plan discussed with Dr. Janese Washington.  Patient will see Dr. Janese Washington on Friday.  Patient expressed understanding and was in agreement with this plan. She also understands that She can call clinic at any time with any questions, concerns, or complaints.   Thank you for allowing me to participate in the care of this very pleasant patient.   Time Total: 15 minutes  Visit consisted of counseling and education dealing with the complex and emotionally intense issues of symptom management in the setting of serious illness.Greater than 50%  of this time was spent counseling and coordinating care related to the above assessment and plan.  Signed by: Altha Harm, PhD, NP-C

## 2022-06-29 ENCOUNTER — Other Ambulatory Visit: Payer: Self-pay | Admitting: *Deleted

## 2022-06-29 DIAGNOSIS — R918 Other nonspecific abnormal finding of lung field: Secondary | ICD-10-CM

## 2022-06-30 ENCOUNTER — Inpatient Hospital Stay: Payer: Medicare PPO | Admitting: Oncology

## 2022-06-30 ENCOUNTER — Encounter: Payer: Self-pay | Admitting: Oncology

## 2022-06-30 VITALS — BP 148/100 | HR 78 | Temp 97.5°F | Resp 16 | Ht 66.0 in | Wt 146.5 lb

## 2022-06-30 DIAGNOSIS — R222 Localized swelling, mass and lump, trunk: Secondary | ICD-10-CM

## 2022-06-30 DIAGNOSIS — R918 Other nonspecific abnormal finding of lung field: Secondary | ICD-10-CM | POA: Diagnosis not present

## 2022-06-30 NOTE — Progress Notes (Signed)
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  She presented with symptoms of Mid back pain which prompted x-rays which led to CT scan.  CT chest without contrast showed subpleural mass in the posterior medial left upper lobe measuring 5.4 x 3.2 x 7.9 cm.  Tumor extends into left T2-T3, T3-T4 and T4-T5 neural foramina.  Mass-effect upon the cord.  Several adjacent satellite nodules identified in the paravertebral left upper lobe.  Bilateral low-attenuation adrenal nodules.  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  Patient is currently on tramadol and uses mainly ibuprofen for her back pain.   Given the significant back pain and concern for spinal cord involvement patient was referred to radiation oncology and started on steroids.  A week after starting steroids when patient went for a biopsy left upper lobe subpleural mass appeared significantly smaller Measuring 1.8 x 1.5 cm as compared to 6.1 x 2.1 cm prior.  Core biopsy was performed but was not diagnostic of malignancy and showed dense fibrous tissue with mixed inflammatory infiltrate.  Given that the PET CT scan also showed area of hypermetabolism in the soft tissue density adjacent to PSIS a second biopsy of that region was attempted but was again nondiagnostic of malignancy.  While awaiting biopsy results patient had received 2 or 3 radiation treatments  given that the mass was involving spinal cord at T3-T4 level.  She is presently being observed conservatively with repeated scans for the mass.  Patient did not wish to pursue an open biopsy      Interval history-patient reports that tramadol has been helping her with her paraspinal pain but she does get nausea and vomiting if she takes tramadol on an empty stomach.  Denies any new complaints at this time.  Denies any pain in her mid back  ECOG PS- 1 Pain scale- 3 Opioid associated constipation- no  Review of systems- Review of Systems  Constitutional:  Negative for chills, fever, malaise/fatigue and weight loss.  HENT:  Negative for congestion, ear discharge and nosebleeds.   Eyes:  Negative for blurred vision.  Respiratory:  Negative for cough, hemoptysis, sputum production, shortness of breath and wheezing.   Cardiovascular:  Negative for chest pain, palpitations, orthopnea and claudication.  Gastrointestinal:  Negative for abdominal pain, blood in stool, constipation, diarrhea, heartburn, melena, nausea and vomiting.  Genitourinary:  Negative for dysuria, flank pain, frequency, hematuria and urgency.  Musculoskeletal:  Positive for back pain. Negative for joint pain and myalgias.  Skin:  Negative for rash.  Neurological:  Negative for dizziness, tingling, focal weakness, seizures, weakness and headaches.  Endo/Heme/Allergies:  Does not bruise/bleed easily.  Psychiatric/Behavioral:  Negative for depression and suicidal ideas. The patient does not have insomnia.       Allergies  Allergen Reactions   Aurothioglucose Other (See Comments)    Granulocytopenia   Codeine Nausea Only and Nausea And Vomiting   Methotrexate Derivatives     Liver enlargement   Sulfa Antibiotics Nausea Only  Past Medical History:  Diagnosis Date   Cancer ([place]) 04/22/2020   lung    Coronary artery disease    Hypercholesteremia    Hypothyroidism    Psoriasis    Psoriatic arthritis ([place])    Skin  cancer    about 3 weeks she has cream to slough the cancer  off her left cheek   Tooth abscess 05/31/2020     Past Surgical History:  Procedure Laterality Date   CARPAL TUNNEL RELEASE  1988   CATARACT EXTRACTION, BILATERAL  1997   CHOLECYSTECTOMY     colonopscopy  2018   COLONOSCOPY  2008   LIVER BIOPSY  1986   STRABISMUS SURGERY Left 1997   TONSILLECTOMY  1945    Social History   Socioeconomic History   Marital status: Married    Spouse name: John    Number of children: 2   Years of education: Not on file   Highest education level: Not on file  Occupational History   Occupation: retired     Comment: [employer]   Tobacco Use   Smoking status: Some Days    Packs/day: 0.25    Years: 49.00    Additional pack years: 0.00    Total pack years: 12.25    Types: Cigarettes   Smokeless tobacco: Never   Tobacco comments:    4 or 5 cigs a day  Vaping Use   Vaping Use: Never used  Substance and Sexual Activity   Alcohol use: Not Currently   Drug use: Never   Sexual activity: Yes  Other Topics Concern   Not on file  Social History Narrative   Lives at home with spouse 4 years   Social Determinants of Health   Financial Resource Strain: Not on file  Food Insecurity: No Food Insecurity (06/28/2022)   Hunger Vital Sign    Worried About Running Out of Food in the Last Year: Never true    Ran Out of Food in the Last Year: Never true  Transportation Needs: No Transportation Needs (06/28/2022)   PRAPARE - Hydrologist (Medical): No    Lack of Transportation (Non-Medical): No  Physical Activity: Not on file  Stress: Not on file  Social Connections: Unknown (06/28/2022)   Social Connection and Isolation Panel [NHANES]    Frequency of Communication with Friends and Family: Not on file    Frequency of Social Gatherings with Friends and Family: Not on file    Attends Religious Services: Not on file    Active Member of Clubs or Organizations:  Not on file    Attends Archivist Meetings: Not on file    Marital Status: Widowed  Intimate Partner Violence: Not At Risk (06/28/2022)   Humiliation, Afraid, Rape, and Kick questionnaire    Fear of Current or Ex-Partner: No    Emotionally Abused: No    Physically Abused: No    Sexually Abused: No    Family History  Problem Relation Age of Onset   Diabetes Mother    Diabetes Sister    Cancer Maternal Uncle    Cancer Paternal Uncle      Current Outpatient Medications:    Artificial Tear Solution (SOOTHE HYDRATION) 1.25 % SOLN, Apply 1 drop to eye as needed., Disp: , Rfl:    ibuprofen (ADVIL) 200 MG tablet, Take 200 mg by mouth every 6 (six) hours as needed., Disp: , Rfl:    levothyroxine (SYNTHROID) 88 MCG tablet, Take by mouth., Disp: ,  Rfl:    vitamin B-12 (CYANOCOBALAMIN) 1000 MCG tablet, Take 1,000 mcg by mouth daily., Disp: , Rfl:    traMADol (ULTRAM) 50 MG tablet, Take 50 mg by mouth every 6 (six) hours as needed. (Patient not taking: Reported on 06/28/2022), Disp: , Rfl:   Physical exam:  Vitals:   06/30/22 1111 06/30/22 1117 06/30/22 1125  BP: (!) 157/96 (!) 157/94 (!) 148/100  Pulse: 86 78   Resp: 16    Temp: (!) 97.5 F (36.4 C)    TempSrc: Tympanic    SpO2: 99%    Weight: 146 lb 8 oz (66.5 kg)    Height: 5\' 6"  (1.676 m)     Physical Exam Eyes:     Pupils: Pupils are equal, round, and reactive to light.  Cardiovascular:     Rate and Rhythm: Normal rate and regular rhythm.     Heart sounds: Normal heart sounds.  Pulmonary:     Effort: Pulmonary effort is normal.     Breath sounds: Normal breath sounds.  Abdominal:     General: Bowel sounds are normal.     Palpations: Abdomen is soft.  Skin:    General: Skin is warm and dry.  Neurological:     Mental Status: She is alert and oriented to person, place, and time.         Latest Ref Rng & Units 10/29/2020    2:19 PM  CMP  Creatinine 0.44 - 1.00 mg/dL 1.20       Latest Ref Rng & Units  05/26/2020    9:40 AM  CBC  WBC 4.0 - 10.5 K/uL 12.4   Hemoglobin 12.0 - 15.0 g/dL 12.7   Hematocrit 36.0 - 46.0 % 40.2   Platelets 150 - 400 K/uL 172     No images are attached to the encounter.  MR Thoracic Spine W Wo Contrast  Result Date: 06/28/2022 CLINICAL DATA:  Mid back pain, paravertebral lung mass EXAM: MRI THORACIC WITHOUT AND WITH CONTRAST TECHNIQUE: Multiplanar and multiecho pulse sequences of the thoracic spine were obtained without and with intravenous contrast. CONTRAST:  69mL GADAVIST GADOBUTROL 1 MMOL/ML IV SOLN COMPARISON:  05/09/2020 MRI thoracic spine, correlation is also made with 02/17/2022 CT chest and MRI lumbar spine 05/31/2022 FINDINGS: Alignment: No significant listhesis. Preservation of the normal thoracic kyphosis. Vertebrae: Redemonstrated abnormal marrow signal and probable infiltration of the left lateral aspect of T2-T4, with increased enhancing material extending into the left neural foramina at T2-T3, T3-T4, and T4-T5, which remains greatest at T3-T4, with rightward displacement of the thecal sac. Rightward displacement of the thecal sac to a lesser extent is also seen at T4-T5. Redemonstrated compression deformity of T12, last seen on the 05/31/2022 lumbar spine MRI, with increased T2 signal and enhancement, likely edema, throughout the T12 vertebral body. Some abnormal signal is seen extending into the left pedicle. Approximately 30% vertebral body height loss anteriorly, previously 20% when remeasured similarly. No retropulsion. Cord: Normal signal and morphology. No abnormal enhancement. Mass effect on the thecal sac, as described above. Paraspinal and other soft tissues: Left paraspinal mass, centered along the left upper lobe and pleura, which measures up to 4.1 x 3.2 x 4.1 cm (AP x TR x CC), previously 4.3 x 2.7 x 4.2 cm when remeasured similarly on prior chest CT. Disc levels: Mild degenerative changes without significant spinal canal stenosis or neural  foraminal narrowing. IMPRESSION: 1. Redemonstrated left paraspinal mass with increased infiltration of the T2-T3, T3-T4, and T4-T5 left neural foramina,  causing mild mass effect on the thecal sac at T3-T4 and T4-T5. 2. Redemonstrated compression deformity of T12, last seen on the 05/31/2022 lumbar spine MRI, with approximately 30% vertebral body height loss anteriorly, previously 20% when remeasured similarly. No retropulsion. Electronically Signed   By: Merilyn Baba M.D.   On: 06/28/2022 17:48   DG Chest 2 View  Result Date: 06/28/2022 CLINICAL DATA:  paravertebral lung mass of unclear etiology; chest pain EXAM: CHEST - 2 VIEW COMPARISON:  None Available. FINDINGS: Paravertebral soft tissue mass not well characterized by radiograph. No consolidation. No visible pleural effusions or pneumothorax. Cardiomediastinal silhouette is unchanged. Biapical pleuroparenchymal scarring. IMPRESSION: Paravertebral soft tissue mass not well characterized by radiograph. No new/interval acute cardiopulmonary abnormality. Electronically Signed   By: Margaretha Sheffield M.D.   On: 06/28/2022 10:54   MR LUMBAR SPINE WO CONTRAST  Result Date: 05/31/2022 CLINICAL DATA:  [place] 1 week ago.  Low back pain worse on the right. EXAM: MRI LUMBAR SPINE WITHOUT CONTRAST TECHNIQUE: Multiplanar, multisequence MR imaging of the lumbar spine was performed. No intravenous contrast was administered. COMPARISON:  Thoracic MRI 05/09/2020 FINDINGS: Segmentation:  5 lumbar type vertebral bodies. Alignment: Normal except for 1-2 mm of anterolisthesis at L4-5 and 2 mm of anterolisthesis at L5-S1 on the basis of degenerative facet disease. Vertebrae: Acute superior endplate fracture at L1 with loss of height of only 10%. No retropulsed bone. No traumatic disc herniation. No other acute bone finding. Benign appearing hemangioma within the right sacral ala. Conus medullaris and cauda equina: Conus extends to the T12-L1 level. Conus and cauda equina appear  normal. Paraspinal and other soft tissues: Negative Disc levels: No significant disc level finding at L2-3 or above. L3-4: Mild bulging of the disc. Mild narrowing of the lateral recesses but no likely neural compression. L4-5: Facet osteoarthritis with 1-2 mm of anterolisthesis. Bulging of the disc. Mild narrowing of the lateral recesses but no likely neural compression. Facet arthritis could be painful. L5-S1: Bilateral facet osteoarthritis with 2 mm of anterolisthesis. Mild bulging of the disc. No compressive stenosis. The facet arthritis could be painful. IMPRESSION: 1. Acute superior endplate fracture at L1 with loss of height of only 10%. No retropulsed bone or traumatic disc herniation. This could certainly be painful. 2. 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 Mild bulging of the discs. No compressive stenosis. The facet arthritis could be painful. 3. L3-4: Disc bulge. Mild narrowing of the lateral recesses but no likely neural compression. Electronically Signed   By: Nelson Chimes M.D.   On: 05/31/2022 16:37     Assessment and plan- Patient is a 82 y.o. female here to discuss MRI results and further management  I have reviewed MRI spine imaging independently and discussed findings with the patient patient had worsening paraspinal pain around the T6 vertebral body region.  MRI thoracic spine shows left paraspinal mass with infiltration between T2-T5 neural foramina causing mass effect on the thecal sac at T3-T4 and T4-T5.  She has had this for the last 1-1/2-year.  We have attempted biopsies in February 2022 which had been negative for malignancy.  I will proceed with a PET CT scan at this time and discuss her case with interventional radiology if another CT-guided biopsy of the mass can be attempted.  Patient will continue as needed tramadol in the meanwhile.  I will call her with the results of PET CT scan results and further management   Visit  Diagnosis  1. Paraspinal mass      Dr. Randa Evens, MD, MPH [facility] [id number] 06/30/2022 3:55 PM

## 2022-06-30 NOTE — Progress Notes (Signed)
No concerns. 

## 2022-07-04 ENCOUNTER — Ambulatory Visit: Payer: Medicare PPO | Admitting: Oncology

## 2022-07-06 ENCOUNTER — Ambulatory Visit
Admission: RE | Admit: 2022-07-06 | Discharge: 2022-07-06 | Disposition: A | Discharge status: Home / Self Care | Payer: Medicare PPO | Source: Ambulatory Visit | Attending: Oncology | Admitting: Oncology

## 2022-07-06 DIAGNOSIS — C3490 Malignant neoplasm of unspecified part of unspecified bronchus or lung: Secondary | ICD-10-CM | POA: Insufficient documentation

## 2022-07-06 DIAGNOSIS — R918 Other nonspecific abnormal finding of lung field: Secondary | ICD-10-CM

## 2022-07-06 LAB — GLUCOSE, CAPILLARY: Glucose-Capillary: 105 mg/dL — ABNORMAL HIGH (ref 70–99)

## 2022-07-06 MED ORDER — FLUDEOXYGLUCOSE F - 18 (FDG) INJECTION
7.6000 | Freq: Once | INTRAVENOUS | Status: AC
  Administered 2022-07-06: 7.43 via INTRAVENOUS

## 2022-07-10 ENCOUNTER — Telehealth: Payer: Self-pay | Admitting: *Deleted

## 2022-07-10 ENCOUNTER — Other Ambulatory Visit: Payer: Self-pay | Admitting: *Deleted

## 2022-07-10 ENCOUNTER — Other Ambulatory Visit: Payer: Self-pay | Admitting: Oncology

## 2022-07-10 DIAGNOSIS — R918 Other nonspecific abnormal finding of lung field: Secondary | ICD-10-CM

## 2022-07-10 NOTE — Telephone Encounter (Addendum)
Daughter called asking about results of PET and if any action is needed as results of them. Her next appointment is 5/13  IMPRESSION: 1. Enlarging hypermetabolic left upper lobe/paraspinal mass consistent with tumor progression. This is invading the neural foramen at T2-3, T3-4 and T4-5. 2. Small but hypermetabolic mediastinal and bilateral hilar lymph nodes suggesting metastatic adenopathy. 3. New hypermetabolic skin lesion involving the left cheek area. Recommend correlation with physical exam. 4. 4.7 cm largely cystic appearing lesion in the subcutaneous fat overlying the gluteus maximus muscle on the left. Findings could be secondary to an abscess or neoplastic process. 5. No findings for abdominal/pelvic metastatic disease or osseous metastatic disease.     Electronically Signed   By: Marijo Sanes M.D.   On: 07/08/2022 13:03

## 2022-07-10 NOTE — Progress Notes (Signed)
   Juliet Rude, MD sent to [facility] Approved for US guided core biopsy of gluteal mass. Should be with local only (unless pt adamant otherwise). Thanks.       Previous Messages    ----- Message ----- From: [facility] Sent: 07/10/2022  11:15 AM EDT To: Juliet Rude, MD Subject: ATTN:  Dr Denna Haggard   US Biopsy                  IR Approval Request:   Procedure:    US guided core biopsy of gluteal mass  Reason:        gluteal mass  History:         PET scan  07/06/2022  Provider:       Dr Randa Evens, MD  Provider Contact #      [place]   [phone]

## 2022-07-11 ENCOUNTER — Telehealth: Payer: Self-pay | Admitting: *Deleted

## 2022-07-11 MED ORDER — TRAMADOL HCL 50 MG PO TABS: 50.0000 mg | ORAL_TABLET | Freq: Four times a day (QID) | ORAL | 0 refills | Status: DC | PRN

## 2022-07-11 NOTE — Telephone Encounter (Signed)
Called the pt.and wanted to see if she is ok to get a bx on Friday of this way or next Monday. Dr Janese Banks had called you yest. And  wanted to see if she is ok with bx. Pt is agreeable for this Friday and she does not have to be NPO. She can eat and drink what ever she wants. She is agreeable to this and she will be there at 2pm at heart and vascular for the bx at 2:30. Also the pt wanted to have the MRI of thoracic since she already had pet scan. So that was taken care of  and I let pt know about that. Also I have called dermatology [phone] with [facility]. Left a message and to ask to call me back. I have told the pt. Also and she is says that she can see any MD there , whoever is first available.

## 2022-07-12 NOTE — Telephone Encounter (Signed)
See separate note.

## 2022-07-13 NOTE — Progress Notes (Signed)
Patient for US guided Core Gluteal Mass Biopsy on Fri 07/14/2022, I called and spoke with the patient on the phone and gave pre-procedure instructions. Pt was made aware to be here at 2p and check in at the [facility] registration desk. Pt stated understanding.  Called 07/13/2022

## 2022-07-14 ENCOUNTER — Other Ambulatory Visit: Payer: Self-pay | Admitting: Oncology

## 2022-07-14 ENCOUNTER — Ambulatory Visit
Admission: RE | Admit: 2022-07-14 | Discharge: 2022-07-14 | Disposition: A | Discharge status: Home / Self Care | Payer: Medicare PPO | Source: Ambulatory Visit | Attending: Oncology | Admitting: Oncology

## 2022-07-14 DIAGNOSIS — M6289 Other specified disorders of muscle: Secondary | ICD-10-CM | POA: Insufficient documentation

## 2022-07-14 DIAGNOSIS — R918 Other nonspecific abnormal finding of lung field: Secondary | ICD-10-CM

## 2022-07-14 DIAGNOSIS — R222 Localized swelling, mass and lump, trunk: Secondary | ICD-10-CM | POA: Insufficient documentation

## 2022-07-14 MED ORDER — LIDOCAINE HCL (PF) 1 % IJ SOLN
10.0000 mL | Freq: Once | INTRAMUSCULAR | Status: AC
  Administered 2022-07-14: 10 mL via INTRADERMAL

## 2022-07-17 ENCOUNTER — Telehealth: Payer: Self-pay | Admitting: *Deleted

## 2022-07-17 NOTE — Telephone Encounter (Signed)
Daughter Raynelle Fanning called requesting that when the results come back to please call her first and then she will conference her mother in to the call as her mother wanted her to hear results at same time

## 2022-07-20 LAB — CYTOLOGY - NON PAP

## 2022-07-20 LAB — SURGICAL PATHOLOGY

## 2022-07-21 ENCOUNTER — Telehealth: Payer: Self-pay

## 2022-07-21 ENCOUNTER — Telehealth: Payer: Self-pay | Admitting: *Deleted

## 2022-07-21 DIAGNOSIS — R918 Other nonspecific abnormal finding of lung field: Secondary | ICD-10-CM

## 2022-07-21 NOTE — Telephone Encounter (Signed)
Received secure epic message from Dr. Jayme Cloud- patient will need appt to discuss possible bronch.  Spoke to patient's daughter, Sonny Masters and scheduled appt for 07/25/2022 at 3:00. Directions and instructions given.  Nothing further needed.

## 2022-07-21 NOTE — Telephone Encounter (Signed)
Spoke with patient and her daughters to review results from recent gluteal mass biopsy. Informed that since results were negative then will need to pursue additional biopsy. Per secure chat with Dr. Smith Robert, pt will need referral for bronchoscopy and possibly excisional biopsy of the left gluteal mass if bronchoscopy is negative. Pt and her family were in agreement with proceeding with referral to pulmonology at this time. Informed to expect call from pulmonary with appt.   Pt stated that she continues to have significant pain and is currently taking 50mg  tramadol every 6 hours with tylenol that is not very effective. Informed that she can take 1-2 tablets tramadol every 6 hours and to not exceed 300mg  tramadol (6 tablets) per day per Dr. Smith Robert. Instructed pt to call back with any questions or needs. Pt verbalized understanding.

## 2022-07-25 ENCOUNTER — Encounter: Payer: Self-pay | Admitting: Pulmonary Disease

## 2022-07-25 ENCOUNTER — Telehealth: Payer: Self-pay

## 2022-07-25 ENCOUNTER — Ambulatory Visit: Payer: Medicare PPO | Admitting: Pulmonary Disease

## 2022-07-25 ENCOUNTER — Inpatient Hospital Stay: Payer: Medicare PPO | Attending: Hospice and Palliative Medicine | Admitting: Hospice and Palliative Medicine

## 2022-07-25 VITALS — BP 124/84 | HR 95 | Temp 98.1°F | Ht 66.0 in | Wt 138.0 lb

## 2022-07-25 DIAGNOSIS — R590 Localized enlarged lymph nodes: Secondary | ICD-10-CM | POA: Diagnosis not present

## 2022-07-25 DIAGNOSIS — R918 Other nonspecific abnormal finding of lung field: Secondary | ICD-10-CM

## 2022-07-25 DIAGNOSIS — Z515 Encounter for palliative care: Secondary | ICD-10-CM | POA: Diagnosis not present

## 2022-07-25 DIAGNOSIS — G893 Neoplasm related pain (acute) (chronic): Secondary | ICD-10-CM | POA: Insufficient documentation

## 2022-07-25 DIAGNOSIS — F17210 Nicotine dependence, cigarettes, uncomplicated: Secondary | ICD-10-CM

## 2022-07-25 MED ORDER — OXYCODONE HCL 5 MG PO TABS: 5.0000 mg | ORAL_TABLET | Freq: Four times a day (QID) | ORAL | 0 refills | Status: DC | PRN

## 2022-07-25 NOTE — Progress Notes (Signed)
Virtual Visit via Telephone Note  I connected with Kylie Washington on 07/25/22 at  2:00 PM EDT by telephone and verified that I am speaking with the correct person using two identifiers.  Location: Patient: Home Provider: Clinic   I discussed the limitations, risks, security and privacy concerns of performing an evaluation and management service by telephone and the availability of in person appointments. I also discussed with the patient that there may be a patient responsible charge related to this service. The patient expressed understanding and agreed to proceed.   History of Present Illness: Kylie Washington is a 82 y.o. 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  Patient has had history of mid back pain leading to CT scans showing a subpleural mass with tumor extension into the left T2-T3, T3-T4, and T4-T5 neural foramina with mass effect.  Patient was started on steroids with significant shrinkage in her mass.  Core biopsy was attempted but nondiagnostic.    Observations/Objective: PET scan on 07/06/2022 showed enlarging hypermetabolic left upper lobe/paraspinal mass consistent with tumor progression.  There was invasion to the neural foramen at T2-T3, T3-T4, and T4-T5.  Patient saw Dr. Jayme Cloud earlier today with plan for repeat biopsy.  Patient was an add-on to my schedule today to address ongoing/persistent pain.  I spoke with patient and daughter by phone.  Patient reports that she has persistent back pain and at times also has radiating pain to the chest.  She has been taking tramadol 50 to 100 mg every 6 hours in addition to Tylenol but has not found that to be effective.  Patient feels like she is tolerating the tramadol reasonably well, although it at times causes indigestion.  Assessment and Plan: Hypermetabolic left upper lobe/paraspinal mass -pending biopsy  Neoplasm related pain  -poorly controlled on tramadol.  Will rotate to oxycodone.  Patient can take additional acetaminophen if needed but would avoid NSAIDs.  Discussed bowel regimen to prevent opioid-induced constipation.  Follow Up Instructions: Follow-up telephone visit 1 week   I discussed the assessment and treatment plan with the patient. The patient was provided an opportunity to ask questions and all were answered. The patient agreed with the plan and demonstrated an understanding of the instructions.   The patient was advised to call back or seek an in-person evaluation if the symptoms worsen or if the condition fails to improve as anticipated.  I provided 15 minutes of non-face-to-face time during this encounter.   Malachy Moan, NP

## 2022-07-25 NOTE — Patient Instructions (Signed)
We have scheduled you for your biopsy on 9 April at 12:30 PM.  This will be done in the main hospital area.  See instructions from the preadmission clinic prior to the procedure.  You will be scheduled for a special CT of the chest that will allow [place] to map the way to the area so that we can biopsy it.  Procedure will be done under general anesthesia.  We will see him in follow-up in 4 to 6 weeks time however, we will stay in contact with you before, during and after the procedure.

## 2022-07-25 NOTE — Telephone Encounter (Signed)
Robotic Bronchoscopy with EBUS 08/07/2022 at 12:30pm Lung Nodule [id number], [id number], [id number]  Synetta Fail please see Bronch info.

## 2022-07-25 NOTE — Progress Notes (Signed)
Subjective:    Patient ID: Kylie Washington, female    DOB: 1941/02/04, 82 y.o.   MRN: [id number] Patient Care Team: Gracelyn Nurse, MD as PCP - General (Internal Medicine) Glory Buff, RN as Oncology Nurse Navigator  Chief Complaint  Patient presents with   Consult    Nodule. SOB comes and goes. No wheezing. Cough with yellow sputum.   HPI The patient is an 82 year old current smoker with a 49-pack-year history of smoking and a history as noted below who presents for consideration of robotic assisted navigational bronchoscopy for diagnosis of a left upper lobe mass.  She is kindly referred by Dr. Owens Shark.  The patient has been noted to have a left upper lobe process for at least 2 years.  Initially presented with a subpleural mass in the posterior medial left upper lobe measuring 5.4 x 3.2 x 7.9 cm extending into the T2-T3, T3-T4 and T4-T5 neural foramina.  There was mass effect upon the cord.  Because of the involvement on the spinal cord the patient was referred to radiation oncology and was started on steroids this was in January 2022.  She then went for biopsy of this mass but significant decrease in the size of the mass was noted.  Nevertheless biopsy obtained at that time via percutaneous means showed only fibrosing changes and no diagnosis of malignancy was made.  Patient then started having increasing back pain as prior in March 2024.  This was more marked around the T6 vertebrae vertebral region.  MRI showed a left paraspinal mass with infiltration between T2 and T5 neural foramina causing mass effect.  A PET/CT scan was then procured and this showed an enlarging hypermetabolic left upper lobe/paraspinal mass consistent with tumor progression invading the neural foramina T2-3, T3-4 and T4-5.  There was also small but hypermetabolic mediastinal and bilateral hilar lymph nodes suggesting potential metastatic adenopathy.  There was a new hypermetabolic skin lesion involving the left  cheek area.  And a 4.7 cm cystic appearing lesion in the subcutaneous fat overlying the gluteus maximus.  The patient had biopsy of the gluteus maximus lesion on 14 July 2022 and this was also nondiagnostic.  Given the difficulty obtaining tissue we have been asked to consider robotic assisted navigational bronchoscopy to obtain biopsy specimen.  The patient has declined open biopsy previously.  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  She does not endorse any shortness of breath, no wheezing, no cough or sputum production.  No hemoptysis.  No lower extremity edema nor calf tenderness.  Does not endorse weight loss nor anorexia.  Her main complaint is that of back pain in the thoracic area.  This is only partially responsive to tramadol.  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  Currently having 1 such a lesion treated on the left cheek.  Patient presented today with daughter.  Another daughter was available via phone.   Review of Systems A 10 point review of systems was performed and it is as noted above otherwise negative.  Past Medical History:  Diagnosis Date   Cancer 04/22/2020   lung    Coronary artery disease    Hypercholesteremia    Hypothyroidism    Psoriasis    Psoriatic arthritis    Skin cancer    about 3 weeks she has cream to slough the cancer  off her left cheek   Tooth abscess 05/31/2020   Past Surgical History:  Procedure Laterality Date  CARPAL TUNNEL RELEASE  1988   CATARACT EXTRACTION, BILATERAL  1997   CHOLECYSTECTOMY     colonopscopy  2018   COLONOSCOPY  2008   LIVER BIOPSY  1986   STRABISMUS SURGERY Left 1997   TONSILLECTOMY  1945   Patient Active Problem List   Diagnosis Date Noted   Lung mass 06/14/2020   Stage 3a chronic kidney disease 06/14/2020   Other specified hypothyroidism 05/29/2017   Bilateral carotid artery disease 04/23/2014   Current tobacco use 04/23/2014   Psoriatic  arthritis 03/03/2014   Hypercholesterolemia 03/03/2014   Graves disease 03/03/2014   Family History  Problem Relation Age of Onset   Diabetes Mother    Diabetes Sister    Cancer Maternal Uncle    Cancer Paternal Uncle    Social History   Tobacco Use   Smoking status: Every Day    Packs/day: 1.00    Years: 49.00    Additional pack years: 0.00    Total pack years: 49.00    Types: Cigarettes   Smokeless tobacco: Never   Tobacco comments:    6-7 cigarettes daily- khj 07/25/2022  Substance Use Topics   Alcohol use: Not Currently   Allergies  Allergen Reactions   Aurothioglucose Other (See Comments)    Granulocytopenia   Codeine Nausea Only and Nausea And Vomiting   Methotrexate Derivatives     Liver enlargement   Sulfa Antibiotics Nausea Only   Current Meds  Medication Sig   Artificial Tear Solution (SOOTHE HYDRATION) 1.25 % SOLN Apply 1 drop to eye as needed.   ibuprofen (ADVIL) 200 MG tablet Take 200 mg by mouth every 6 (six) hours as needed.   levothyroxine (SYNTHROID) 88 MCG tablet Take by mouth.   traMADol (ULTRAM) 50 MG tablet Take 1 tablet (50 mg total) by mouth every 6 (six) hours as needed. (Patient taking differently: Take 50-100 mg by mouth every 6 (six) hours as needed. *Not to exceed /day*)   vitamin B-12 (CYANOCOBALAMIN) 1000 MCG tablet Take 1,000 mcg by mouth daily.   Immunization History  Administered Date(s) Administered   Influenza Inj Mdck Quad Pf 12/27/2018, 12/15/2020, 12/30/2021   Influenza,inj,Quad PF,6+ Mos 01/01/2020   PNEUMOCOCCAL CONJUGATE-20 06/17/2021   Pneumococcal Polysaccharide-23 03/25/2012   Tdap 05/26/2022       Objective:   Physical Exam BP 124/84 (BP Location: Left Arm, Cuff Size: Normal)   Pulse 95   Temp 98.1 F (36.7 C)   Ht  (1.676 m)   Wt 138 lb (62.6 kg)   SpO2 97%   BMI 22.27 kg/m   SpO2: 97 % O2 Device: None (Room air)  GENERAL: Well-nourished, well-developed woman, no acute distress.  Fully  ambulatory.  No conversational dyspnea.  Very well-groomed. HEAD: Normocephalic, atraumatic.  EYES: Pupils equal, round, reactive to light.  No scleral icterus.  MOUTH: Dentition intact, oral mucosa moist.  No thrush. NECK: Supple. No thyromegaly. Trachea midline. No JVD.  No adenopathy. PULMONARY: Good air entry bilaterally.  Coarse, otherwise, no adventitious sounds. CARDIOVASCULAR: S1 and S2. Regular rate and rhythm.  No rubs, murmurs or gallops heard. ABDOMEN: Benign. MUSCULOSKELETAL: No joint deformity, no clubbing, no edema.  NEUROLOGIC: No overt focal deficit, no gait disturbance, speech is fluent. SKIN: Intact,warm,dry.  There is an area of ulceration over the cheek showing some associated edema/induration, this is covered with a bandage. PSYCH: Mood and behavior normal.  Representative image from the PET/CT performed 06 July 2022 showing the left upper lobe paraspinal mass:  Assessment & Plan:     ICD-10-CM   1. Mass of upper lobe of left lung  R91.8 CT SUPER D CHEST WO [facility] PILOT   Difficult to assess pleural plane Advised chance for pneumothorax high Robotic assisted bronchoscopy Patient does not wish open biopsy at present    2. Mediastinal adenopathy  R59.0 CT SUPER D CHEST WO [facility] PILOT   Endobronchial ultrasound at the time of bronchoscopy    3. Tobacco dependence due to cigarettes  F17.210    Patient counseled regards to discontinuation of smoking total counseling time 3 to 5 minutes.     Orders Placed This Encounter  Procedures   CT SUPER D CHEST WO [facility]    Bronch scheduled for 08/07/2022    Standing Status:   Future    Standing Expiration Date:   07/25/2023    Order Specific Question:   Preferred imaging location?    Answer:   [facility]   Explain the challenging situation with regards to the patient's upper lobe mass.  The chance for pneumothorax is high given that the pleural plane cannot be clearly delineated.  The patient however  has failed to have diagnosis on multiple biopsy attempts.  The patient and her daughter understand that the chances for pneumothorax are significant in this situation.  However the patient does not want to undergo open biopsy at present.  Will proceed with robotic assisted bronchoscopy for attempt at diagnosis.  We will also examine the mediastinal adenopathy with endobronchial ultrasound and perform TBNA as able.  Benefits, limitations and potential complications of the procedure were discussed with the patient/family.  Complications from bronchoscopy are rare and most often minor, but if they occur they may include breathing difficulty, vocal cord spasm, hoarseness, slight fever, vomiting, dizziness, bronchospasm, infection, low blood oxygen, bleeding from biopsy site, or an allergic reaction to medications.  It is uncommon for patients to experience other more serious complications for example: Collapsed lung requiring chest tube placement, respiratory failure, heart attack and/or cardiac arrhythmia.  Patient agrees to proceed.  Will see the patient in follow-up in 6 weeks time however, we will stay in contact with the patient before, during and after the procedure.  Discussed with Dr. Smith Robert via secure chat.    Gailen Shelter, MD Advanced Bronchoscopy PCCM [facility]    *This note was dictated using voice recognition software/Dragon.  Despite best efforts to proofread, errors can occur which can change the meaning. Any transcriptional errors that result from this process are unintentional and may not be fully corrected at the time of dictation.

## 2022-07-25 NOTE — H&P (View-Only) (Signed)
 Subjective:    Patient ID: Kylie Washington, female    DOB: 12/15/1940, 82 y.o.   MRN: [id number] Patient Care Team: Johnston, John D, MD as PCP - General (Internal Medicine) Rhode, Hayley, RN as Oncology Nurse Navigator  Chief Complaint  Patient presents with   Consult    Nodule. SOB comes and goes. No wheezing. Cough with yellow sputum.   HPI The patient is an 82-year-old current smoker with a 49-pack-year history of smoking and a history as noted below who presents for consideration of robotic assisted navigational bronchoscopy for diagnosis of a left upper lobe mass.  She is kindly referred by Dr. Archana Rao.  The patient has been noted to have a left upper lobe process for at least 2 years.  Initially presented with a subpleural mass in the posterior medial left upper lobe measuring 5.4 x 3.2 x 7.9 cm extending into the T2-T3, T3-T4 and T4-T5 neural foramina.  There was mass effect upon the cord.  Because of the involvement on the spinal cord the patient was referred to radiation oncology and was started on steroids this was in January 2022.  She then went for biopsy of this mass but significant decrease in the size of the mass was noted.  Nevertheless biopsy obtained at that time via percutaneous means showed only fibrosing changes and no diagnosis of malignancy was made.  Patient then started having increasing back pain as prior in March 2024.  This was more marked around the T6 vertebrae vertebral region.  MRI showed a left paraspinal mass with infiltration between T2 and T5 neural foramina causing mass effect.  A PET/CT scan was then procured and this showed an enlarging hypermetabolic left upper lobe/paraspinal mass consistent with tumor progression invading the neural foramina T2-3, T3-4 and T4-5.  There was also small but hypermetabolic mediastinal and bilateral hilar lymph nodes suggesting potential metastatic adenopathy.  There was a new hypermetabolic skin lesion involving the left  cheek area.  And a 4.7 cm cystic appearing lesion in the subcutaneous fat overlying the gluteus maximus.  The patient had biopsy of the gluteus maximus lesion on 14 July 2022 and this was also nondiagnostic.  Given the difficulty obtaining tissue we have been asked to consider robotic assisted navigational bronchoscopy to obtain biopsy specimen.  The patient has declined open biopsy previously.  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  She does not endorse any shortness of breath, no wheezing, no cough or sputum production.  No hemoptysis.  No lower extremity edema nor calf tenderness.  Does not endorse weight loss nor anorexia.  Her main complaint is that of back pain in the thoracic area.  This is only partially responsive to tramadol.  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  Currently having 1 such a lesion treated on the left cheek.  Patient presented today with daughter.  Another daughter was available via phone.   Review of Systems A 10 point review of systems was performed and it is as noted above otherwise negative.  Past Medical History:  Diagnosis Date   Cancer 04/22/2020   lung    Coronary artery disease    Hypercholesteremia    Hypothyroidism    Psoriasis    Psoriatic arthritis    Skin cancer    about 3 weeks she has cream to slough the cancer  off her left cheek   Tooth abscess 05/31/2020   Past Surgical History:  Procedure Laterality Date     CARPAL TUNNEL RELEASE  1988   CATARACT EXTRACTION, BILATERAL  1997   CHOLECYSTECTOMY     colonopscopy  2018   COLONOSCOPY  2008   LIVER BIOPSY  1986   STRABISMUS SURGERY Left 1997   TONSILLECTOMY  1945   Patient Active Problem List   Diagnosis Date Noted   Lung mass 06/14/2020   Stage 3a chronic kidney disease 06/14/2020   Other specified hypothyroidism 05/29/2017   Bilateral carotid artery disease 04/23/2014   Current tobacco use 04/23/2014   Psoriatic  arthritis 03/03/2014   Hypercholesterolemia 03/03/2014   Graves disease 03/03/2014   Family History  Problem Relation Age of Onset   Diabetes Mother    Diabetes Sister    Cancer Maternal Uncle    Cancer Paternal Uncle    Social History   Tobacco Use   Smoking status: Every Day    Packs/day: 1.00    Years: 49.00    Additional pack years: 0.00    Total pack years: 49.00    Types: Cigarettes   Smokeless tobacco: Never   Tobacco comments:    6-7 cigarettes daily- khj 07/25/2022  Substance Use Topics   Alcohol use: Not Currently   Allergies  Allergen Reactions   Aurothioglucose Other (See Comments)    Granulocytopenia   Codeine Nausea Only and Nausea And Vomiting   Methotrexate Derivatives     Liver enlargement   Sulfa Antibiotics Nausea Only   Current Meds  Medication Sig   Artificial Tear Solution (SOOTHE HYDRATION) 1.25 % SOLN Apply 1 drop to eye as needed.   ibuprofen (ADVIL) 200 MG tablet Take 200 mg by mouth every 6 (six) hours as needed.   levothyroxine (SYNTHROID) 88 MCG tablet Take by mouth.   traMADol (ULTRAM) 50 MG tablet Take 1 tablet (50 mg total) by mouth every 6 (six) hours as needed. (Patient taking differently: Take 50-100 mg by mouth every 6 (six) hours as needed. *Not to exceed 300mg/day*)   vitamin B-12 (CYANOCOBALAMIN) 1000 MCG tablet Take 1,000 mcg by mouth daily.   Immunization History  Administered Date(s) Administered   Influenza Inj Mdck Quad Pf 12/27/2018, 12/15/2020, 12/30/2021   Influenza,inj,Quad PF,6+ Mos 01/01/2020   PNEUMOCOCCAL CONJUGATE-20 06/17/2021   Pneumococcal Polysaccharide-23 03/25/2012   Tdap 05/26/2022       Objective:   Physical Exam BP 124/84 (BP Location: Left Arm, Cuff Size: Normal)   Pulse 95   Temp 98.1 F (36.7 C)   Ht 5' 6" (1.676 m)   Wt 138 lb (62.6 kg)   SpO2 97%   BMI 22.27 kg/m   SpO2: 97 % O2 Device: None (Room air)  GENERAL: Well-nourished, well-developed woman, no acute distress.  Fully  ambulatory.  No conversational dyspnea.  Very well-groomed. HEAD: Normocephalic, atraumatic.  EYES: Pupils equal, round, reactive to light.  No scleral icterus.  MOUTH: Dentition intact, oral mucosa moist.  No thrush. NECK: Supple. No thyromegaly. Trachea midline. No JVD.  No adenopathy. PULMONARY: Good air entry bilaterally.  Coarse, otherwise, no adventitious sounds. CARDIOVASCULAR: S1 and S2. Regular rate and rhythm.  No rubs, murmurs or gallops heard. ABDOMEN: Benign. MUSCULOSKELETAL: No joint deformity, no clubbing, no edema.  NEUROLOGIC: No overt focal deficit, no gait disturbance, speech is fluent. SKIN: Intact,warm,dry.  There is an area of ulceration over the cheek showing some associated edema/induration, this is covered with a bandage. PSYCH: Mood and behavior normal.  Representative image from the PET/CT performed 06 July 2022 showing the left upper lobe paraspinal mass:       Assessment & Plan:     ICD-10-CM   1. Mass of upper lobe of left lung  R91.8 CT SUPER D CHEST WO [facility] PILOT   Difficult to assess pleural plane Advised chance for pneumothorax high Robotic assisted bronchoscopy Patient does not wish open biopsy at present    2. Mediastinal adenopathy  R59.0 CT SUPER D CHEST WO [facility] PILOT   Endobronchial ultrasound at the time of bronchoscopy    3. Tobacco dependence due to cigarettes  F17.210    Patient counseled regards to discontinuation of smoking total counseling time 3 to 5 minutes.     Orders Placed This Encounter  Procedures   CT SUPER D CHEST WO [facility]ILOT    Bronch scheduled for 08/07/2022    Standing Status:   Future    Standing Expiration Date:   07/25/2023    Order Specific Question:   Preferred imaging location?    Answer:   [facility]   Explain the challenging situation with regards to the patient's upper lobe mass.  The chance for pneumothorax is high given that the pleural plane cannot be clearly delineated.  The patient however  has failed to have diagnosis on multiple biopsy attempts.  The patient and her daughter understand that the chances for pneumothorax are significant in this situation.  However the patient does not want to undergo open biopsy at present.  Will proceed with robotic assisted bronchoscopy for attempt at diagnosis.  We will also examine the mediastinal adenopathy with endobronchial ultrasound and perform TBNA as able.  Benefits, limitations and potential complications of the procedure were discussed with the patient/family.  Complications from bronchoscopy are rare and most often minor, but if they occur they may include breathing difficulty, vocal cord spasm, hoarseness, slight fever, vomiting, dizziness, bronchospasm, infection, low blood oxygen, bleeding from biopsy site, or an allergic reaction to medications.  It is uncommon for patients to experience other more serious complications for example: Collapsed lung requiring chest tube placement, respiratory failure, heart attack and/or cardiac arrhythmia.  Patient agrees to proceed.  Will see the patient in follow-up in 6 weeks time however, we will stay in contact with the patient before, during and after the procedure.  Discussed with Dr. Rao via secure chat.    C. Laura Claudius Mich, MD Advanced Bronchoscopy PCCM  [facility]    *This note was dictated using voice recognition software/Dragon.  Despite best efforts to proofread, errors can occur which can change the meaning. Any transcriptional errors that result from this process are unintentional and may not be fully corrected at the time of dictation. 

## 2022-07-26 ENCOUNTER — Encounter: Payer: Self-pay | Admitting: Oncology

## 2022-07-26 NOTE — Telephone Encounter (Signed)
PreAdmit- 4/22 8-12pm Covid Test- 4/26 10:00am  I have notified the patient.   Nothing further needed.

## 2022-07-26 NOTE — Telephone Encounter (Signed)
Pt informed to continue to monitor site and to call back if discharge returns. Pt and daughter verbalized understanding

## 2022-07-26 NOTE — Telephone Encounter (Signed)
So is she still having discharge or is it slowing down?

## 2022-07-26 NOTE — Telephone Encounter (Signed)
For the codes 17915, [id number], [phone] Prior Auth Not Required Refer # [id number]

## 2022-07-26 NOTE — Telephone Encounter (Signed)
So the discharge was right after she had the procedure but not happening anymore? If that's the case- she does not need to be seen. Just monitor

## 2022-07-28 ENCOUNTER — Inpatient Hospital Stay (HOSPITAL_BASED_OUTPATIENT_CLINIC_OR_DEPARTMENT_OTHER): Payer: Medicare PPO | Admitting: Nurse Practitioner

## 2022-07-28 ENCOUNTER — Ambulatory Visit: Payer: Medicare PPO | Admitting: Pulmonary Disease

## 2022-07-28 ENCOUNTER — Other Ambulatory Visit: Payer: Self-pay

## 2022-07-28 VITALS — BP 133/85 | HR 80 | Temp 97.5°F | Resp 20 | Ht 66.0 in | Wt 137.5 lb

## 2022-07-28 DIAGNOSIS — L0231 Cutaneous abscess of buttock: Secondary | ICD-10-CM

## 2022-07-28 DIAGNOSIS — G893 Neoplasm related pain (acute) (chronic): Secondary | ICD-10-CM | POA: Diagnosis not present

## 2022-07-28 DIAGNOSIS — R918 Other nonspecific abnormal finding of lung field: Secondary | ICD-10-CM | POA: Diagnosis present

## 2022-07-28 MED ORDER — DOXYCYCLINE HYCLATE 100 MG PO TABS: 100.0000 mg | ORAL_TABLET | Freq: Two times a day (BID) | ORAL | 0 refills | Status: DC

## 2022-07-28 NOTE — Progress Notes (Signed)
Symptom Management Clinic  [facility] at [facility] A Department of the [facility]. [facility] [address], [address] [place], [place] [address] [phone] (phone) [phone] (fax)  Patient Care Team: Gracelyn Nurse, MD as PCP - General (Internal Medicine) Glory Buff, RN as Oncology Nurse Navigator Salena Saner, MD as Consulting Physician (Pulmonary Disease) Creig Hines, MD as Consulting Physician (Oncology)   Name of the patient: Kylie Washington  [id number]  1941-01-25   Date of visit: 07/28/22  Diagnosis- Lung mass & mediastinal adenopathy  Chief complaint/ Reason for visit- gluteal wound  Heme/Onc history:  Oncology History   No history exists.    Interval history- Patient is 82 year old female currently undergoing workup for lung mass concerning for primary lung malignancy, awaiting bronchoscopy, who presents to Symptom Management clinic for concerns of drainage from gluteal wound post biopsy. She says area appeared as small/dime lump under her skin several months ago and gradually enlarged and became more painful. No apparent heat or wound previously. As she underwent workup for possible lung cancer, this area was biopsied. Since that time she has had ongoing drainage of thin blood tinged liquid from the biopsy site. It's tender to sit on and staining her clothing. No fevers or chills. Has not been prescribed medication for the area previously.    Review of systems- Review of Systems  Constitutional:  Positive for malaise/fatigue. Negative for chills and fever.  Musculoskeletal:  Positive for back pain.  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    Coronary artery disease    Hypercholesteremia    Hypothyroidism    Psoriasis    Psoriatic arthritis    Skin cancer    about 3 weeks she has cream to slough the cancer  off her left cheek   Tooth abscess 05/31/2020    Past Surgical History:  Procedure Laterality Date   CARPAL TUNNEL RELEASE  1988   CATARACT EXTRACTION, BILATERAL  1997   CHOLECYSTECTOMY     colonopscopy  2018   COLONOSCOPY  2008   LIVER BIOPSY  1986   STRABISMUS SURGERY Left 1997   TONSILLECTOMY  1945    Social History   Socioeconomic History   Marital status: Widowed    Spouse name: John    Number of children: 2   Years of education: Not on file   Highest education level: Not on file  Occupational History   Occupation: retired     Comment: [employer]   Tobacco Use   Smoking status: Every Day    Packs/day: 1.00    Years: 49.00    Additional pack years: 0.00    Total pack years: 49.00    Types: Cigarettes   Smokeless tobacco: Never   Tobacco comments:    6-7 cigarettes daily- khj 07/25/2022  Vaping Use   Vaping Use: Never used  Substance and Sexual Activity   Alcohol use: Not Currently   Drug use: Never   Sexual activity: Yes  Other Topics Concern   Not on file  Social History Narrative   Lives at home with  spouse 87 years   Social Determinants of Health   Financial Resource Strain: Not on file  Food Insecurity: No Food Insecurity (06/28/2022)   Hunger Vital Sign    Worried About Running Out of Food in the Last Year: Never true    Ran Out of Food in the Last Year: Never true  Transportation Needs: No Transportation Needs (06/28/2022)   PRAPARE - Administrator, Civil Service (Medical): No    Lack of Transportation (Non-Medical): No  Physical Activity: Not on file  Stress: Not on file  Social Connections: Unknown (06/28/2022)   Social Connection and Isolation Panel [NHANES]    Frequency of Communication with Friends and Family: Not on file    Frequency of Social  Gatherings with Friends and Family: Not on file    Attends Religious Services: Not on file    Active Member of Clubs or Organizations: Not on file    Attends Banker Meetings: Not on file    Marital Status: Widowed  Intimate Partner Violence: Not At Risk (06/28/2022)   Humiliation, Afraid, Rape, and Kick questionnaire    Fear of Current or Ex-Partner: No    Emotionally Abused: No    Physically Abused: No    Sexually Abused: No    Family History  Problem Relation Age of Onset   Diabetes Mother    Diabetes Sister    Cancer Maternal Uncle    Cancer Paternal Uncle      Current Outpatient Medications:    Artificial Tear Solution (SOOTHE HYDRATION) 1.25 % SOLN, Apply 1 drop to eye as needed., Disp: , Rfl:    ibuprofen (ADVIL) 200 MG tablet, Take 200 mg by mouth every 6 (six) hours as needed., Disp: , Rfl:    levothyroxine (SYNTHROID) 88 MCG tablet, Take by mouth., Disp: , Rfl:    oxyCODONE (OXY IR/ROXICODONE) 5 MG immediate release tablet, Take 1 tablet (5 mg total) by mouth every 6 (six) hours as needed for severe pain., Disp: 30 tablet, Rfl: 0   vitamin B-12 (CYANOCOBALAMIN) 1000 MCG tablet, Take 1,000 mcg by mouth daily., Disp: , Rfl:   Physical exam:  Vitals:   07/28/22 1036  BP: 133/85  Pulse: 80  Resp: 20  Temp: (!) 97.5 F (36.4 C)  TempSrc: Tympanic  Weight: 137 lb 8 oz (62.4 kg)  Height:  (1.676 m)   Physical Exam Constitutional:      Appearance: She is not ill-appearing.  Skin:    General: Skin is warm.     Findings: Lesion (left buttock, 8cm x 4 cm mass palpated with point of serosanguinous drainage. Tender to firm palpation. Well circumscribed.) present.  Neurological:     Mental Status: She is alert and oriented to person, place, and time.  Psychiatric:        Mood and Affect: Mood normal.        Behavior: Behavior normal.         Latest Ref Rng & Units 10/29/2020    2:19 PM  CMP  Creatinine 0.44 - 1.00 mg/dL 1.61       Latest Ref  Rng & Units 05/26/2020    9:40 AM  CBC  WBC 4.0 - 10.5 K/uL 12.4   Hemoglobin 12.0 - 15.0 g/dL 09.6   Hematocrit 04.5 - 46.0 % 40.2   Platelets 150 - 400 K/uL 172     No images are attached to the encounter.  [place] CORE BIOPSY (SOFT TISSUE)  Result Date: 07/14/2022  INDICATION: Hypermetabolic soft tissue mass on PET-CT EXAM: 1. Ultrasound-guided aspiration of subcutaneous fluid 2. Ultrasound-guided core needle biopsy of subcutaneous soft tissue mass MEDICATIONS: None. ANESTHESIA/SEDATION: Local analgesia COMPLICATIONS: None immediate. PROCEDURE: Informed written consent was obtained from the patient after a thorough discussion of the procedural risks, benefits and alternatives. All questions were addressed. Maximal Sterile Barrier Technique was utilized including caps, mask, sterile gowns, sterile gloves, sterile drape, hand hygiene and skin antiseptic. A timeout was performed prior to the initiation of the procedure. The patient was placed supine on the exam table. Ultrasound of the left gluteal soft tissues demonstrated heterogeneous lesion with large fluid-filled component and surrounding edema. Skin entry site was marked, and the overlying skin was prepped draped in the standard sterile fashion. Local analgesia was obtained with 1% lidocaine. Decision was made to proceed with both aspiration of the fluid component and core biopsy of the more solid surrounding soft tissue. Using ultrasound guidance, a 19 gauge Yueh catheter was advanced into the center of the fluid-filled cavity. Subsequently, approximately 15 mL of burgundy serosanguineous fluid was aspirated. Sample was sent for cytology. Attention was then turned to the more solid component of the lesion. Using ultrasound guidance, core needle biopsy was performed of the left gluteal mass using an 18 gauge core biopsy device x4 total passes. Specimens were submitted in formalin to pathology for further handling. Limited postprocedure imaging  demonstrated no hematoma. A clean dressing was placed after manual hemostasis. The patient tolerated the procedure well without immediate complication. IMPRESSION: Successful ultrasound-guided aspiration and core needle biopsy of the heterogeneous mass in the left gluteal soft tissues. Samples sent for both cytology and pathology analysis. Electronically Signed   By: Olive Bass M.D.   On: 07/14/2022 15:57   [place] FNA SOFT TISSUE  Result Date: 07/14/2022 INDICATION: Hypermetabolic soft tissue mass on PET-CT EXAM: 1. Ultrasound-guided aspiration of subcutaneous fluid 2. Ultrasound-guided core needle biopsy of subcutaneous soft tissue mass MEDICATIONS: None. ANESTHESIA/SEDATION: Local analgesia COMPLICATIONS: None immediate. PROCEDURE: Informed written consent was obtained from the patient after a thorough discussion of the procedural risks, benefits and alternatives. All questions were addressed. Maximal Sterile Barrier Technique was utilized including caps, mask, sterile gowns, sterile gloves, sterile drape, hand hygiene and skin antiseptic. A timeout was performed prior to the initiation of the procedure. The patient was placed supine on the exam table. Ultrasound of the left gluteal soft tissues demonstrated heterogeneous lesion with large fluid-filled component and surrounding edema. Skin entry site was marked, and the overlying skin was prepped draped in the standard sterile fashion. Local analgesia was obtained with 1% lidocaine. Decision was made to proceed with both aspiration of the fluid component and core biopsy of the more solid surrounding soft tissue. Using ultrasound guidance, a 19 gauge Yueh catheter was advanced into the center of the fluid-filled cavity. Subsequently, approximately 15 mL of burgundy serosanguineous fluid was aspirated. Sample was sent for cytology. Attention was then turned to the more solid component of the lesion. Using ultrasound guidance, core needle biopsy was performed of  the left gluteal mass using an 18 gauge core biopsy device x4 total passes. Specimens were submitted in formalin to pathology for further handling. Limited postprocedure imaging demonstrated no hematoma. A clean dressing was placed after manual hemostasis. The patient tolerated the procedure well without immediate complication. IMPRESSION: Successful ultrasound-guided aspiration and core needle biopsy of the heterogeneous mass in the left gluteal soft tissues. Samples sent for both cytology and pathology analysis. Electronically Signed  By: Olive Bass M.D.   On: 07/14/2022 15:57   NM PET Image Restag (PS) Skull Base To Thigh  Result Date: 07/08/2022 CLINICAL DATA:  Subsequent treatment strategy for lung cancer. EXAM: NUCLEAR MEDICINE PET SKULL BASE TO THIGH TECHNIQUE: 7.43 mCi F-18 FDG was injected intravenously. Full-ring PET imaging was performed from the skull base to thigh after the radiotracer. CT data was obtained and used for attenuation correction and anatomic localization. Fasting blood glucose: 105 mg/dl COMPARISON:  Multiple previous imaging studies. The most recent chest CT is 02/17/2022 and the most recent PET-CT is 07/24/2020. Thoracic spine MRI 06/28/2022 FINDINGS: Mediastinal blood pool activity: SUV max 2.19 Liver activity: SUV max NA NECK: There is a new hypermetabolic skin lesion involving the left cheek area. SUV max is 10.42. Recommend correlation with physical exam. No enlarged or hypermetabolic cervical lymph nodes. Incidental CT findings: Stable carotid artery calcifications. CHEST: Enlarging left upper paraspinal mass is hypermetabolic with SUV max of 19.85. As demonstrated on the recent thoracic spine MRI the tumor is invading the neural foramen at T2-3, T3-4 and T4-5. This is most significant at T3-4 with there is also mass effect on the left side of the thecal sac. Significant progression since the prior PET-CT. Small but hypermetabolic pre-vascular, precarinal and bilateral  hilar nodes suggesting metastatic adenopathy. Right hilar node has an SUV max of 3.84. Prevascular node has an SUV max of 3.93. No pulmonary nodules to suggest pulmonary metastatic disease. No hypermetabolic breast masses, supraclavicular or axillary adenopathy. Incidental CT findings: Stable vascular disease and small hiatal hernia. Stable dense pericardial calcification anteriorly. ABDOMEN/PELVIS: No abnormal hypermetabolic activity within the liver, pancreas, adrenal glands, or spleen. No hypermetabolic lymph nodes in the abdomen or pelvis. Incidental CT findings: Stable hepatic cysts. Stable bilateral adrenal gland adenomas. Stable advanced vascular disease. SKELETON: No findings suspicious for osseous metastatic disease. There is a 4.7 cm largely cystic appearing lesion in the subcutaneous fat overlying the gluteus maximus muscle on the left. SUV max is 12.56. Findings could be secondary to an abscess or neoplastic process. Recommend clinical correlation. Incidental CT findings: None. IMPRESSION: 1. Enlarging hypermetabolic left upper lobe/paraspinal mass consistent with tumor progression. This is invading the neural foramen at T2-3, T3-4 and T4-5. 2. Small but hypermetabolic mediastinal and bilateral hilar lymph nodes suggesting metastatic adenopathy. 3. New hypermetabolic skin lesion involving the left cheek area. Recommend correlation with physical exam. 4. 4.7 cm largely cystic appearing lesion in the subcutaneous fat overlying the gluteus maximus muscle on the left. Findings could be secondary to an abscess or neoplastic process. 5. No findings for abdominal/pelvic metastatic disease or osseous metastatic disease. Electronically Signed   By: Rudie Meyer M.D.   On: 07/08/2022 13:03   MR Thoracic Spine W Wo Contrast  Result Date: 06/28/2022 CLINICAL DATA:  Mid back pain, paravertebral lung mass EXAM: MRI THORACIC WITHOUT AND WITH CONTRAST TECHNIQUE: Multiplanar and multiecho pulse sequences of the  thoracic spine were obtained without and with intravenous contrast. CONTRAST:  6mL GADAVIST GADOBUTROL 1 MMOL/ML IV SOLN COMPARISON:  05/09/2020 MRI thoracic spine, correlation is also made with 02/17/2022 CT chest and MRI lumbar spine 05/31/2022 FINDINGS: Alignment: No significant listhesis. Preservation of the normal thoracic kyphosis. Vertebrae: Redemonstrated abnormal marrow signal and probable infiltration of the left lateral aspect of T2-T4, with increased enhancing material extending into the left neural foramina at T2-T3, T3-T4, and T4-T5, which remains greatest at T3-T4, with rightward displacement of the thecal sac. Rightward displacement of the thecal sac to  a lesser extent is also seen at T4-T5. Redemonstrated compression deformity of T12, last seen on the 05/31/2022 lumbar spine MRI, with increased T2 signal and enhancement, likely edema, throughout the T12 vertebral body. Some abnormal signal is seen extending into the left pedicle. Approximately 30% vertebral body height loss anteriorly, previously 20% when remeasured similarly. No retropulsion. Cord: Normal signal and morphology. No abnormal enhancement. Mass effect on the thecal sac, as described above. Paraspinal and other soft tissues: Left paraspinal mass, centered along the left upper lobe and pleura, which measures up to 4.1 x 3.2 x 4.1 cm (AP x TR x CC), previously 4.3 x 2.7 x 4.2 cm when remeasured similarly on prior chest CT. Disc levels: Mild degenerative changes without significant spinal canal stenosis or neural foraminal narrowing. IMPRESSION: 1. Redemonstrated left paraspinal mass with increased infiltration of the T2-T3, T3-T4, and T4-T5 left neural foramina, causing mild mass effect on the thecal sac at T3-T4 and T4-T5. 2. Redemonstrated compression deformity of T12, last seen on the 05/31/2022 lumbar spine MRI, with approximately 30% vertebral body height loss anteriorly, previously 20% when remeasured similarly. No retropulsion.  Electronically Signed   By: Wiliam Ke M.D.   On: 06/28/2022 17:48    Assessment and plan- Patient is a 82 y.o. female   Left Gluteal abscess- seen on pet, cystic appearing, s/p core biopsy and cytology which was negative for malignancy, positive for findings of inflammation. Recommend evaluation for possible I&D with surgery. Referral sent to Dr. Everlene Farrier. Start warm compresses 4 times a day and doxycycline 100 mg twice a day x 14 days.  Lung mass & adenopathy- currently undergoing workup but concerning for malignancy. She is awaiting bronchoscopy and biopsy. She is followed by Dr. Smith Robert. I updated [facility], RN who is navigating for patient and will assist with referral to surgery.  Back pain- known mass with tumor extension into left T2-T3, T3-T4, and T4-T5, recently enlarging. Core biopsy was nondiagnostic. Pain managed by palliative care.   Disposition:  Referral to Dr Everlene Farrier. RTC if symptoms don't improve or worsen. Follow up with Dr. Smith Robert as scheduled.    Visit Diagnosis 1. Abscess, gluteal, left     Patient expressed understanding and was in agreement with this plan. She also understands that She can call clinic at any time with any questions, concerns, or complaints.   Thank you for allowing me to participate in the care of this very pleasant patient.   Consuello Masse, DNP, AGNP-C, AOCNP [facility] at [facility] [phone]  CC: Dr Smith Robert

## 2022-07-31 ENCOUNTER — Encounter: Payer: Self-pay | Admitting: Hospice and Palliative Medicine

## 2022-07-31 ENCOUNTER — Encounter: Payer: Self-pay | Admitting: Surgery

## 2022-07-31 ENCOUNTER — Ambulatory Visit: Payer: Medicare PPO | Admitting: Surgery

## 2022-07-31 ENCOUNTER — Encounter
Admission: RE | Admit: 2022-07-31 | Discharge: 2022-07-31 | Disposition: A | Discharge status: Home / Self Care | Payer: Medicare PPO | Source: Ambulatory Visit | Attending: Pulmonary Disease | Admitting: Pulmonary Disease

## 2022-07-31 VITALS — BP 124/88 | HR 80 | Temp 98.0°F | Ht 66.0 in | Wt 137.6 lb

## 2022-07-31 VITALS — Ht 66.0 in | Wt 138.0 lb

## 2022-07-31 DIAGNOSIS — R222 Localized swelling, mass and lump, trunk: Secondary | ICD-10-CM

## 2022-07-31 DIAGNOSIS — Z01812 Encounter for preprocedural laboratory examination: Secondary | ICD-10-CM

## 2022-07-31 DIAGNOSIS — N1831 Chronic kidney disease, stage 3a: Secondary | ICD-10-CM

## 2022-07-31 HISTORY — DX: Cortical age-related cataract, bilateral: H25.013

## 2022-07-31 HISTORY — DX: Disorder of arteries and arterioles, unspecified: I77.9

## 2022-07-31 HISTORY — DX: Chronic kidney disease, stage 3a: N18.31

## 2022-07-31 NOTE — Progress Notes (Signed)
Patient ID: Kylie Washington, female   DOB: Oct 13, 1940, 82 y.o.   MRN: [id number]  HPI Zaria Taha is a 82 y.o. female consultation at the request of Dr. Smith Robert and Mrs Freida Busman [facility].  For a left gluteal mass.  Has had a left upper lobe mass for about 2 years and now has mediastinal mass extending into the neural foramina at the levels of T2-T5.  Is causing some mass effect upon the cord.  Had multiple biopsies to include mediastinal biopsy as well as biopsy of the left buttocks without a definitive diagnosis.  He has had severe back pain for a few months and has noticed that over the last couple months had a left gluteal mass.  She states that at some point in time it drained and this was shortly after the biopsy.  Denies any fevers any chills it is uncomfortable but her main complaint is back pain. Did have a PET/CT that have personally reviewed showing a hypermetabolic left upper lobe and paraspinal mass involving the spinal processes as well as hypermetabolic mediastinal nodes and a left gluteal mass.  She has a history of skin cancer.  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   HPI  Past Medical History:  Diagnosis Date   Bilateral carotid artery disease    Cancer 04/22/2020   lung    Cataract cortical, senile, bilateral    Coronary artery disease    Hypercholesteremia    Hypothyroidism    Psoriasis    Psoriatic arthritis    Skin cancer    about 3 weeks she has cream to slough the cancer  off her left cheek   Stage 3a chronic kidney disease    Tooth abscess 05/31/2020    Past Surgical History:  Procedure Laterality Date   CARPAL TUNNEL RELEASE Left 1988   CATARACT EXTRACTION, BILATERAL  1997   COLONOSCOPY  2008   COLONOSCOPY  2018   LIVER BIOPSY  1986   STRABISMUS SURGERY Left 1997   TONSILLECTOMY AND ADENOIDECTOMY  1945   VAGINAL HYSTERECTOMY       Family History  Problem Relation Age of Onset   Diabetes Mother    Diabetes Sister    Cancer Maternal Uncle    Cancer Paternal Uncle     Social History Social History   Tobacco Use   Smoking status: Every Day    Packs/day: 0.25    Years: 49.00    Additional pack years: 0.00    Total pack years: 12.25    Types: Cigarettes   Smokeless tobacco: Never   Tobacco comments:    6-7 cigarettes daily- khj 07/25/2022  Vaping Use   Vaping Use: Never used  Substance Use Topics   Alcohol use: Not Currently   Drug use: Never    Allergies  Allergen Reactions   Aurothioglucose Other (See Comments)    Granulocytopenia   Codeine Nausea Only and Nausea And Vomiting   Methotrexate Derivatives     Liver enlargement   Sulfa Antibiotics Nausea Only    Current Outpatient Medications  Medication Sig Dispense Refill   Artificial Tear Solution (SOOTHE HYDRATION) 1.25 % SOLN Apply 1 drop to eye as needed.     doxycycline (VIBRA-TABS) 100 MG tablet Take 1 tablet (100 mg total) by mouth 2 (two) times daily for 14 days. 28 tablet 0   levothyroxine (SYNTHROID)  88 MCG tablet Take 88 mcg by mouth daily before breakfast.     oxyCODONE (OXY IR/ROXICODONE) 5 MG immediate release tablet Take 1 tablet (5 mg total) by mouth every 6 (six) hours as needed for severe pain. 30 tablet 0   vitamin B-12 (CYANOCOBALAMIN) 1000 MCG tablet Take 1,000 mcg by mouth daily.     No current facility-administered medications for this visit.     Review of Systems Full ROS  was asked and was negative except for the information on the HPI  Physical Exam Blood pressure 124/88, pulse 80, temperature 98 F (36.7 C), temperature source Oral, height  (1.676 m), weight 137 lb 9.6 oz (62.4 kg), SpO2 99 %. CONSTITUTIONAL: NAD. EYES: Pupils are equal, round,  Sclera are non-icteric. EARS, NOSE, MOUTH AND THROAT:  The oral mucosa is pink and moist. Hearing is intact to voice. LYMPH NODES:  Lymph nodes in the neck are  normal. RESPIRATORY:  Lungs are clear. There is normal respiratory effort, with equal breath sounds bilaterally, and without pathologic use of accessory muscles. CARDIOVASCULAR: Heart is regular without murmurs, gallops, or rubs. GI: The abdomen is  soft, nontender, and nondistended. There are no palpable masses. There is no hepatosplenomegaly. There are normal bowel sounds in all quadrants. GU: Rectal deferred.   Large gluteal mass measuring 6 x 8 cm on the left side.  There is no evidence of fluctuance there is no evidence of erythema there is no evidence of tenderness.  This is consistent with a mass within the subcutaneous tissue and adjacent to the muscle.  This is not an abscess MUSCULOSKELETAL: Normal muscle strength and tone. No cyanosis or edema.   SKIN: There is a skin lesion left cheek covered by bandaid NEUROLOGIC: Motor and sensation is grossly normal. Cranial nerves are grossly intact. PSYCH:  Oriented to person, place and time. Affect is normal.  Data Reviewed  I have personally reviewed the patient's imaging, laboratory findings and medical records.    Assessment/Plan  26.-year-old female with posterior mediastinal mass involving thoracic root and severe chronic pain.  In addition she has multiple mediastinal lymphadenopathy as well as a left large gluteal mass.  I do not think it is an active infection.  This does not look like a classic gluteal abscess.  I am concerned potentially about being a metastatic lesion.  With the patient and the family in detail about her disease process.  We still do not have an appropriate tissue diagnosis.  She will be undergoing bronchoscopy by Dr. Jayme Cloud.  Discussed with her in detail about potentially doing an excisional biopsy for diagnostic purposes.  At this point they wish to wait until she completes the bronchoscopy.  I discussed with her and the family the importance of obtaining a proper diagnosis.  I will like to see her back in a couple  weeks. I spent 60 minutes in this encounter including personally reviewing images, extensive review of medical records, coordinating her care, placing orders and performing appropriate mentation   Sterling Big, MD FACS General Surgeon 07/31/2022, 2:14 PM

## 2022-07-31 NOTE — Patient Instructions (Addendum)
Your procedure is scheduled on: Monday, April 29 Report to the Registration Desk on the 1st floor of the [facility] To find out your arrival time, please call [phone] between 1PM - 3PM on: Friday, April 26 If your arrival time is 6:00 am, do not arrive before that time as the [facility] entrance doors do not open until 6:00 am.  REMEMBER: Instructions that are not followed completely may result in serious medical risk, up to and including death; or upon the discretion of your surgeon and anesthesiologist your surgery may need to be rescheduled.  Do not eat or drink after midnight the night before surgery.  No gum chewing or hard candies.  One week prior to surgery: starting April 22 Stop Anti-inflammatories (NSAIDS) such as Advil, Aleve, Ibuprofen, Motrin, Naproxen, Naprosyn and Aspirin based products such as Excedrin, Goody's Powder, BC Powder. Stop ANY OVER THE COUNTER supplements until after surgery. Vitamin B12. You may however, continue to take Tylenol if needed for pain up until the day of surgery.  Continue taking all prescribed medications   TAKE ONLY THESE MEDICATIONS THE MORNING OF SURGERY WITH A SIP OF WATER:  Levothyroxine Oxycodone if needed for pain  No Alcohol for 24 hours before or after surgery.  No Smoking including e-cigarettes for 24 hours before surgery.  No chewable tobacco products for at least 6 hours before surgery.  No nicotine patches on the day of surgery.  Do not use any "recreational" drugs for at least a week (preferably 2 weeks) before your surgery.  Please be advised that the combination of cocaine and anesthesia may have negative outcomes, up to and including death. If you test positive for cocaine, your surgery will be cancelled.  On the morning of surgery brush your teeth with toothpaste and water, you may rinse your mouth with mouthwash if you wish. Do not swallow any toothpaste or mouthwash.  Do not wear jewelry, make-up, hairpins,  clips or nail polish.  Do not wear lotions, powders, or perfumes.   Contact lenses, hearing aids and dentures may not be worn into surgery.  Do not bring valuables to the hospital. [facility] is not responsible for any missing/lost belongings or valuables.   Notify your doctor if there is any change in your medical condition (cold, fever, infection).  Wear comfortable clothing (specific to your surgery type) to the hospital.  If you are being discharged the day of surgery, you will not be allowed to drive home. You will need a responsible individual to drive you home and stay with you for 24 hours after surgery.   If you are taking public transportation, you will need to have a responsible individual with you.  Please call the Pre-admissions Testing Dept. at [phone] if you have any questions about these instructions.  Surgery Visitation Policy:  Patients having surgery or a procedure may have two visitors.  Children under the age of 13 must have an adult with them who is not the patient.

## 2022-07-31 NOTE — Patient Instructions (Addendum)
If you have any concerns or questions, please feel free to call our office.   Excision of Skin Lesions Excision of a skin lesion is the removal of a section of skin by making small incisions in the skin. Through this process, the lesion is completely removed. This procedure is often done to treat or prevent cancer or infection. It may also be done to improve cosmetic appearance. You may have this procedure to remove: Cancerous (malignant) growths, such as basal cell carcinoma, squamous cell carcinoma, or melanoma. Noncancerous (benign) growths, such as a cyst or lipoma. Growths, such as moles or skin tags, which may be removed for cosmetic reasons. Various excision or surgical techniques may be used depending on your condition, the location of the lesion, and your overall health. Tell your health care provider about: Any allergies you have. All medicines you are taking, including vitamins, herbs, eye drops, creams, and over-the-counter medicines. Any problems you or family members have had with anesthetic medicines. Any bleeding problems you have. Any surgeries you have had. Any medical conditions you have. Whether you are pregnant or may be pregnant. What are the risks? Generally, this is a safe procedure. However, problems may occur, including: Bleeding. Infection. Scarring. Recurrence of the cyst, lipoma, or cancer. Allergic reaction to anesthetics, surgical materials, or ointments. Damage to nerves, blood vessels, muscles, or other structures. What happens before the procedure? Medicines Ask your health care provider about: Changing or stopping your regular medicines. This is especially important if you are taking diabetes medicines or blood thinners. Taking medicines such as aspirin and ibuprofen. These medicines can thin your blood. Do not take these medicines unless your health care provider tells you to take them. Taking over-the-counter medicines, vitamins, herbs, and  supplements. General instructions Do not use any products that contain nicotine or tobacco. These products include cigarettes, chewing tobacco, and vaping devices, such as e-cigarettes. If you need help quitting, ask your health care provider. Follow instructions from your health care provider about eating or drinking restrictions. Ask your health care provider: How your surgery site will be marked. What steps will be taken to help prevent infection. These steps may include: Removing hair at the surgery site. Washing skin with a germ-killing soap. Taking antibiotic medicine. Ask your health care provider if you will need someone to take you home from the hospital or clinic after the procedure. What happens during the procedure?  You will be given a medicine to numb the area (local anesthetic). Your health care provider will remove the lesions using one of the following excision techniques. Complete surgical excision. This procedure may be done to treat a cancerous growth or a noncancerous cyst or lesion. A small scalpel or scissors will be used to gently cut around and under the lesion until it is completely removed. If bleeding occurs, it will be stopped with a device that delivers heat (electrocautery). The edges of the wound may be stitched (sutured) together. A bandage (dressing) will be applied. Samples will be sent to a lab for testing. Excision of a cyst. An incision will be made on the cyst. The entire cyst will be removed through the incision. The incision may be closed with sutures. Shave excision. This may be done to remove a mole or other small growths. A small blade or scalpel will be used to shave off the lesion. The wound is usually left to heal on its own without sutures. The sample may be sent to a lab for testing. Punch excision. This  may be done to completely remove a mole or other small growths. A small tool that is like a cookie cutter or a hole punch is used to cut  a circle shape out of the skin. The outer edges of the skin will be sutured together. The sample may be sent to a lab for testing. Mohs micrographic surgery. This is usually done to treat skin cancer. This type of excision is mostly used on the face and ears. This procedure is minimally invasive, and it ensures the best cosmetic outcome. A scalpel or a loop instrument will be used to remove layers of the lesion until all the abnormal or cancerous tissue has been removed. The wound may be sutured, depending on its size. The tissue will be checked under a microscope right away. The procedure may vary among health care providers and hospitals. At the end of any of these procedures, antibiotic ointment will be applied as needed. What happens after the procedure? Talk with your health care provider to discuss any test results, treatment options, and if necessary, the need for more tests. Keep all follow-up visits. This is important. Summary Excision of a skin lesion is the removal of a section of skin by making small incisions in the skin. This procedure is often done to treat or prevent skin cancer, remove benign growths, or it may be done to improve cosmetic appearance. Various excision or surgical techniques may be used depending on your condition, the location of the lesion, and your overall health. After the procedure, talk with your health care provider to discuss any test results, treatment options, and if necessary, the need for more tests. Keep all follow-up visits. This is important. This information is not intended to replace advice given to you by your health care provider. Make sure you discuss any questions you have with your health care provider. Document Revised: 10/26/2020 Document Reviewed: 10/26/2020 Elsevier Patient Education  2023 [employer].

## 2022-08-01 ENCOUNTER — Inpatient Hospital Stay (HOSPITAL_BASED_OUTPATIENT_CLINIC_OR_DEPARTMENT_OTHER): Payer: Medicare PPO | Admitting: Hospice and Palliative Medicine

## 2022-08-01 DIAGNOSIS — Z515 Encounter for palliative care: Secondary | ICD-10-CM | POA: Diagnosis not present

## 2022-08-01 DIAGNOSIS — M546 Pain in thoracic spine: Secondary | ICD-10-CM | POA: Diagnosis not present

## 2022-08-01 DIAGNOSIS — F32A Depression, unspecified: Secondary | ICD-10-CM | POA: Diagnosis not present

## 2022-08-01 MED ORDER — ESCITALOPRAM OXALATE 10 MG PO TABS: 10.0000 mg | ORAL_TABLET | Freq: Every day | ORAL | 1 refills | Status: DC

## 2022-08-01 NOTE — Progress Notes (Signed)
Virtual Visit via Telephone Note  I connected with [name] on 08/01/22 at  3:40 PM EDT by telephone and verified that I am speaking with the correct person using two identifiers.  Location: Patient: Home Provider: Clinic   I discussed the limitations, risks, security and privacy concerns of performing an evaluation and management service by telephone and the availability of in person appointments. I also discussed with the patient that there may be a patient responsible charge related to this service. The patient expressed understanding and agreed to proceed.   History of Present Illness: Kylie Washington is a 82 y.o. 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  Patient has had history of mid back pain leading to CT scans showing a subpleural mass with tumor extension into the left T2-T3, T3-T4, and T4-T5 neural foramina with mass effect.  Patient was started on steroids with significant shrinkage in her mass.  Core biopsy was attempted but nondiagnostic.    Observations/Objective: PET scan on 07/06/2022 showed enlarging hypermetabolic left upper lobe/paraspinal mass consistent with tumor progression.  There was invasion to the neural foramen at T2-T3, T3-T4, and T4-T5.  Patient was seen by Dr. Everlene Farrier yesterday for large gluteal mass concerning for metastatic lesion.   Today, patient reports that her pain has improved on oxycodone, which she is taking 2-3 times daily. She denies any adverse effects from pain meds.   Patient did describe some concerns about pain being cardiac related. However, she denies chest pressure, shortness of breath, etc. More likely pain is related to known lung mass. However, she is pending EKG for preop workup and we discussed red flags/triggers that would necessitate evaluation in the ED.   Patient endorses depression given the passing of her husband and now dealing with  multiple health problems herself. She asked about trying an antidepressant. She is also interested in counseling referral.   Assessment and Plan: Hypermetabolic left upper lobe/paraspinal mass -pending bronch/biopsy  Neoplasm related pain -Improved on oxycodone  Depression - start Lexapro  daily. Referral to counseling  Follow Up Instructions: Follow-up telephone visit 3-4 weeks   I discussed the assessment and treatment plan with the patient. The patient was provided an opportunity to ask questions and all were answered. The patient agreed with the plan and demonstrated an understanding of the instructions.   The patient was advised to call back or seek an in-person evaluation if the symptoms worsen or if the condition fails to improve as anticipated.  I provided 15 minutes of non-face-to-face time during this encounter.   Malachy Moan, NP

## 2022-08-03 ENCOUNTER — Ambulatory Visit
Admission: RE | Admit: 2022-08-03 | Discharge: 2022-08-03 | Disposition: A | Discharge status: Home / Self Care | Payer: Medicare PPO | Source: Ambulatory Visit | Attending: Pulmonary Disease | Admitting: Pulmonary Disease

## 2022-08-03 ENCOUNTER — Encounter
Admission: RE | Admit: 2022-08-03 | Discharge: 2022-08-03 | Disposition: A | Discharge status: Home / Self Care | Payer: Medicare PPO | Source: Ambulatory Visit | Attending: Pulmonary Disease | Admitting: Pulmonary Disease

## 2022-08-03 DIAGNOSIS — Z01818 Encounter for other preprocedural examination: Secondary | ICD-10-CM | POA: Insufficient documentation

## 2022-08-03 DIAGNOSIS — Z1152 Encounter for screening for COVID-19: Secondary | ICD-10-CM | POA: Insufficient documentation

## 2022-08-03 DIAGNOSIS — R590 Localized enlarged lymph nodes: Secondary | ICD-10-CM | POA: Insufficient documentation

## 2022-08-03 DIAGNOSIS — R918 Other nonspecific abnormal finding of lung field: Secondary | ICD-10-CM | POA: Insufficient documentation

## 2022-08-03 DIAGNOSIS — N1831 Chronic kidney disease, stage 3a: Secondary | ICD-10-CM | POA: Insufficient documentation

## 2022-08-03 DIAGNOSIS — Z01812 Encounter for preprocedural laboratory examination: Secondary | ICD-10-CM

## 2022-08-03 LAB — BASIC METABOLIC PANEL
Anion gap: 10 (ref 5–15)
BUN: 14 mg/dL (ref 8–23)
CO2: 29 mmol/L (ref 22–32)
Calcium: 8.7 mg/dL — ABNORMAL LOW (ref 8.9–10.3)
Chloride: 101 mmol/L (ref 98–111)
Creatinine, Ser: 1.1 mg/dL — ABNORMAL HIGH (ref 0.44–1.00)
GFR, Estimated: 50 mL/min — ABNORMAL LOW (ref >60)
Glucose, Bld: 132 mg/dL — ABNORMAL HIGH (ref 70–99)
Potassium: 3.2 mmol/L — ABNORMAL LOW (ref 3.5–5.1)
Sodium: 140 mmol/L (ref 135–145)

## 2022-08-03 LAB — CBC
HCT: 43 % (ref 36.0–46.0)
Hemoglobin: 13.2 g/dL (ref 12.0–15.0)
MCH: 27.6 pg (ref 26.0–34.0)
MCHC: 30.7 g/dL (ref 30.0–36.0)
MCV: 90 fL (ref 80.0–100.0)
Platelets: 174 10*3/uL (ref 150–400)
RBC: 4.78 MIL/uL (ref 3.87–5.11)
RDW: 13.5 % (ref 11.5–15.5)
WBC: 10.6 10*3/uL — ABNORMAL HIGH (ref 4.0–10.5)
nRBC: 0 % (ref 0.0–0.2)

## 2022-08-04 ENCOUNTER — Inpatient Hospital Stay: Admission: RE | Admit: 2022-08-04 | Payer: Medicare PPO | Source: Ambulatory Visit

## 2022-08-04 LAB — SARS CORONAVIRUS 2 (TAT 6-24 HRS): SARS Coronavirus 2: NEGATIVE

## 2022-08-06 ENCOUNTER — Other Ambulatory Visit: Payer: Self-pay | Admitting: Hospice and Palliative Medicine

## 2022-08-06 MED ORDER — IPRATROPIUM-ALBUTEROL 0.5-2.5 (3) MG/3ML IN SOLN
3.0000 mL | Freq: Once | RESPIRATORY_TRACT | Status: AC
  Administered 2022-08-07: 3 mL via RESPIRATORY_TRACT

## 2022-08-06 MED ORDER — ORAL CARE MOUTH RINSE: 15.0000 mL | Freq: Once | OROMUCOSAL | Status: AC

## 2022-08-06 MED ORDER — CHLORHEXIDINE GLUCONATE 0.12 % MT SOLN
15.0000 mL | Freq: Once | OROMUCOSAL | Status: AC
  Administered 2022-08-07: 15 mL via OROMUCOSAL

## 2022-08-06 MED ORDER — SODIUM CHLORIDE 0.9 % IV SOLN: Freq: Once | INTRAVENOUS | Status: DC

## 2022-08-06 MED ORDER — FAMOTIDINE 20 MG PO TABS
20.0000 mg | ORAL_TABLET | Freq: Once | ORAL | Status: AC
  Administered 2022-08-07: 20 mg via ORAL

## 2022-08-06 MED ORDER — LACTATED RINGERS IV SOLN: INTRAVENOUS | Status: DC

## 2022-08-07 ENCOUNTER — Ambulatory Visit: Payer: Medicare PPO | Admitting: Urgent Care

## 2022-08-07 ENCOUNTER — Ambulatory Visit: Payer: Medicare PPO

## 2022-08-07 ENCOUNTER — Encounter: Payer: Self-pay | Admitting: Pulmonary Disease

## 2022-08-07 ENCOUNTER — Ambulatory Visit
Admission: RE | Admit: 2022-08-07 | Discharge: 2022-08-07 | Disposition: A | Discharge status: Home / Self Care | Payer: Medicare PPO | Attending: Pulmonary Disease | Admitting: Pulmonary Disease

## 2022-08-07 ENCOUNTER — Other Ambulatory Visit: Payer: Self-pay

## 2022-08-07 ENCOUNTER — Encounter
Admission: RE | Disposition: A | Discharge status: Home / Self Care | Payer: Self-pay | Source: Home / Self Care | Attending: Pulmonary Disease

## 2022-08-07 ENCOUNTER — Ambulatory Visit: Payer: Medicare PPO | Admitting: Anesthesiology

## 2022-08-07 DIAGNOSIS — C44329 Squamous cell carcinoma of skin of other parts of face: Secondary | ICD-10-CM | POA: Insufficient documentation

## 2022-08-07 DIAGNOSIS — R918 Other nonspecific abnormal finding of lung field: Secondary | ICD-10-CM

## 2022-08-07 DIAGNOSIS — F17210 Nicotine dependence, cigarettes, uncomplicated: Secondary | ICD-10-CM | POA: Diagnosis not present

## 2022-08-07 DIAGNOSIS — E039 Hypothyroidism, unspecified: Secondary | ICD-10-CM | POA: Insufficient documentation

## 2022-08-07 DIAGNOSIS — R590 Localized enlarged lymph nodes: Secondary | ICD-10-CM

## 2022-08-07 DIAGNOSIS — I2510 Atherosclerotic heart disease of native coronary artery without angina pectoris: Secondary | ICD-10-CM | POA: Diagnosis not present

## 2022-08-07 DIAGNOSIS — N183 Chronic kidney disease, stage 3 unspecified: Secondary | ICD-10-CM | POA: Insufficient documentation

## 2022-08-07 HISTORY — PX: ENDOBRONCHIAL ULTRASOUND: SHX5096

## 2022-08-07 LAB — CULTURE, BAL-QUANTITATIVE W GRAM STAIN
Gram Stain: NONE SEEN
Special Requests: NORMAL

## 2022-08-07 SURGERY — BRONCHOSCOPY, WITH BIOPSY USING ELECTROMAGNETIC NAVIGATION
Anesthesia: General | Laterality: Left

## 2022-08-07 MED ORDER — EPHEDRINE SULFATE (PRESSORS) 50 MG/ML IJ SOLN
INTRAMUSCULAR | Status: DC | PRN
  Administered 2022-08-07 (×2): 5 mg via INTRAVENOUS

## 2022-08-07 MED ORDER — IPRATROPIUM-ALBUTEROL 0.5-2.5 (3) MG/3ML IN SOLN
3.0000 mL | Freq: Once | RESPIRATORY_TRACT | Status: AC
  Administered 2022-08-07: 3 mL via RESPIRATORY_TRACT

## 2022-08-07 MED ORDER — OXYCODONE HCL 5 MG PO TABS
5.0000 mg | ORAL_TABLET | Freq: Once | ORAL | Status: AC | PRN
  Administered 2022-08-07: 5 mg via ORAL

## 2022-08-07 MED ORDER — PHENYLEPHRINE HCL-NACL 20-0.9 MG/250ML-% IV SOLN
INTRAVENOUS | Status: DC | PRN
  Administered 2022-08-07: 20 ug/min via INTRAVENOUS

## 2022-08-07 MED ORDER — OXYCODONE HCL 5 MG PO TABS
ORAL_TABLET | ORAL | Status: AC
  Filled 2022-08-07: qty 1

## 2022-08-07 MED ORDER — LACTATED RINGERS IV SOLN: INTRAVENOUS | Status: DC

## 2022-08-07 MED ORDER — ONDANSETRON HCL 4 MG/2ML IJ SOLN
INTRAMUSCULAR | Status: AC
  Filled 2022-08-07: qty 2

## 2022-08-07 MED ORDER — SUGAMMADEX SODIUM 200 MG/2ML IV SOLN
INTRAVENOUS | Status: DC | PRN
  Administered 2022-08-07: 50 mg via INTRAVENOUS
  Administered 2022-08-07 (×2): 75 mg via INTRAVENOUS

## 2022-08-07 MED ORDER — DEXMEDETOMIDINE HCL IN NACL 80 MCG/20ML IV SOLN
INTRAVENOUS | Status: DC | PRN
  Administered 2022-08-07: 8 ug via INTRAVENOUS

## 2022-08-07 MED ORDER — SEVOFLURANE IN SOLN
RESPIRATORY_TRACT | Status: AC
  Filled 2022-08-07: qty 250

## 2022-08-07 MED ORDER — OXYCODONE HCL 5 MG/5ML PO SOLN: 5.0000 mg | Freq: Once | ORAL | Status: AC | PRN

## 2022-08-07 MED ORDER — DEXAMETHASONE SODIUM PHOSPHATE 10 MG/ML IJ SOLN
INTRAMUSCULAR | Status: AC
  Filled 2022-08-07: qty 1

## 2022-08-07 MED ORDER — ONDANSETRON HCL 4 MG/2ML IJ SOLN
4.0000 mg | Freq: Once | INTRAMUSCULAR | Status: AC | PRN
  Administered 2022-08-07: 4 mg via INTRAVENOUS

## 2022-08-07 MED ORDER — ONDANSETRON HCL 4 MG/2ML IJ SOLN
INTRAMUSCULAR | Status: DC | PRN
  Administered 2022-08-07: 4 mg via INTRAVENOUS

## 2022-08-07 MED ORDER — CHLORHEXIDINE GLUCONATE 0.12 % MT SOLN
OROMUCOSAL | Status: AC
  Filled 2022-08-07: qty 15

## 2022-08-07 MED ORDER — DEXMEDETOMIDINE HCL IN NACL 80 MCG/20ML IV SOLN
INTRAVENOUS | Status: AC
  Filled 2022-08-07: qty 20

## 2022-08-07 MED ORDER — FENTANYL CITRATE (PF) 100 MCG/2ML IJ SOLN
25.0000 ug | INTRAMUSCULAR | Status: DC | PRN
  Administered 2022-08-07: 25 ug via INTRAVENOUS

## 2022-08-07 MED ORDER — PROPOFOL 10 MG/ML IV BOLUS
INTRAVENOUS | Status: DC | PRN
  Administered 2022-08-07: 100 mg via INTRAVENOUS

## 2022-08-07 MED ORDER — LIDOCAINE HCL (CARDIAC) PF 100 MG/5ML IV SOSY
PREFILLED_SYRINGE | INTRAVENOUS | Status: DC | PRN
  Administered 2022-08-07: 60 mg via INTRAVENOUS

## 2022-08-07 MED ORDER — DEXAMETHASONE SODIUM PHOSPHATE 10 MG/ML IJ SOLN
INTRAMUSCULAR | Status: DC | PRN
  Administered 2022-08-07: 10 mg via INTRAVENOUS

## 2022-08-07 MED ORDER — OXYCODONE HCL 5 MG PO TABS: 5.0000 mg | ORAL_TABLET | Freq: Four times a day (QID) | ORAL | 0 refills | Status: DC | PRN

## 2022-08-07 MED ORDER — ROCURONIUM BROMIDE 10 MG/ML (PF) SYRINGE
PREFILLED_SYRINGE | INTRAVENOUS | Status: AC
  Filled 2022-08-07: qty 10

## 2022-08-07 MED ORDER — ROCURONIUM BROMIDE 100 MG/10ML IV SOLN
INTRAVENOUS | Status: DC | PRN
  Administered 2022-08-07: 15 mg via INTRAVENOUS
  Administered 2022-08-07: 30 mg via INTRAVENOUS

## 2022-08-07 MED ORDER — FAMOTIDINE 20 MG PO TABS
ORAL_TABLET | ORAL | Status: AC
  Filled 2022-08-07: qty 1

## 2022-08-07 MED ORDER — FENTANYL CITRATE (PF) 100 MCG/2ML IJ SOLN
INTRAMUSCULAR | Status: DC | PRN
  Administered 2022-08-07 (×2): 50 ug via INTRAVENOUS

## 2022-08-07 MED ORDER — PHENYLEPHRINE HCL-NACL 20-0.9 MG/250ML-% IV SOLN
INTRAVENOUS | Status: AC
  Filled 2022-08-07: qty 250

## 2022-08-07 MED ORDER — IPRATROPIUM-ALBUTEROL 0.5-2.5 (3) MG/3ML IN SOLN
RESPIRATORY_TRACT | Status: AC
  Filled 2022-08-07: qty 3

## 2022-08-07 MED ORDER — ACETAMINOPHEN 10 MG/ML IV SOLN
1000.0000 mg | Freq: Once | INTRAVENOUS | Status: DC | PRN
  Administered 2022-08-07: 1000 mg via INTRAVENOUS

## 2022-08-07 MED ORDER — ACETAMINOPHEN 10 MG/ML IV SOLN
INTRAVENOUS | Status: AC
  Filled 2022-08-07: qty 100

## 2022-08-07 MED ORDER — FENTANYL CITRATE (PF) 100 MCG/2ML IJ SOLN
INTRAMUSCULAR | Status: AC
  Filled 2022-08-07: qty 2

## 2022-08-07 MED ORDER — PHENYLEPHRINE HCL (PRESSORS) 10 MG/ML IV SOLN
INTRAVENOUS | Status: DC | PRN
  Administered 2022-08-07 (×5): 80 ug via INTRAVENOUS

## 2022-08-07 NOTE — Op Note (Signed)
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  Multiple prior negative biopsies.  Previously treated with radiation empirically.  She presents for robotic assisted bronchoscopy to obtain biopsies of the mass.  Also indication of small mediastinal lymph nodes that were somewhat FDG avid and she will undergo inspection with endobronchial ultrasound.  Preoperative Diagnosis: Left upper lobe mass, with significant necrotic center, rule out CA.  Reactive versus pathologic mediastinal adenopathy. Post Procedure Diagnosis: Same as above Consent: Verbal/Written: obtained  Benefits, limitations and potential complications of the procedure were discussed with the patient/family.  Complications from bronchoscopy are rare and most often minor, but if they occur they may include breathing difficulty, vocal cord spasm, hoarseness, slight fever, vomiting, dizziness, bronchospasm, infection, low blood oxygen, bleeding from biopsy site, or an allergic reaction to medications.  It is uncommon for patients to experience other more serious complications for example: Collapsed lung requiring chest tube placement, respiratory failure, heart attack and/or cardiac arrhythmia.  Patient understood the potential complications and agreed to proceed.  Surgeon: Gailen Shelter, MD Assistant/Scrub: Leonie Man, RRT Circulator: N/A Anesthesiologist/CRNA: Reed Breech, MD/Lisa Jodelle Green, CRNA, Ronnald Ramp, CRNA Cytotechnology: Verdell Carmine, available during procedure Fluoroscopy technician: Cleotis Nipper, RT Representatives: Daphine Deutscher, Tim Lair ([employer]); Sudie Bailey, [employer].  Type of  Anesthesia: General endotracheal  Procedures Performed:   Robotic bronchoscopy: Procedure consists of robotic navigation comprised of electromagnetics, optical pattern recognition and robotic kinematic data - to triangulate bronchoscope location during the procedure and provide accurate positional data to biopsy a lesion. Cellvizio probe based confocal laser endomicroscopy (pCLE) utilizing blue laser endomicroscopy. Augmented fluoroscopy with [employer].  Description of Procedure:  Robotic bronchoscopy: The patient was brought to Procedure Room 2 (Bronchoscopy Suite) in the OR area where appropriate timeout was taken with the staff after the patient was inducted under general anesthesia.  The patient was inducted under general anesthesia and intubated by the anesthesia team.  Patient was intubated with a 8.0 ET tube without difficulty.  Tube was secured at 4 cm above the carina.  A Portex adapter was placed on the ET tube flange.  Once the patient was under adequate general anesthesia the Olympus therapeutic video bronchoscope was advanced and an anatomic airway tour and surveillance bronchoscopy was performed.The distal trachea appeared unremarkable. The main carina was sharp.  No secretions were seen in either right or left mainstem bronchi. The RUL, RLL, RML appeared to be free of endobronchial masses, lesions, or purulent secretions. Likewise, the LLL/LUL appeared to be free of endobronchial masses, lesions, or purulent secretions.  There were some copious benign appearing secretions on the left upper lobe that were suctioned until cleared.  Once the survey bronchoscopy was completed, registration for the augmented fluoroscopy ([employer]) was then performed with the fluoroscopic C arm.  Once this was completed, the robotic bronchoscope ET tube adapter was placed and ETT was cut to proper length and secured on the mid plane.  The [facility] robotic scope was then advanced through the ETT and registration  was performed successfully.  There was good correlation between the robotic mapping and bronchoscopic mapping. With the assistance of fused navigation, the bronchoscope was advanced to the LUL nodule/mass. The tip of the working channel sat within 17 mm of the nodule  Positioning was  confirmed with augmented fluoroscopy.  At this point Cellvizio probe based confocal laser endomicroscopy (pCLE) was utilized to confirm target acquisition.  The images through [facility] were consistent with target lesion plus necrotic changes.   Augmented fluoroscopy via [employer] was utilized to optimize the position most favorable for biopsies, then the robotic bronchoscope was anchored to maintain position.  A total of 10 transbronchial biopsies were obtained at this point, the lesion could be seen moving during biopsies.  ROSE showed mostly inflammatory and necrotic changes on first 2 slides, third slide showed atypical cells.  After confirmation of excellent hemostasis, a total of 4 passes with a cytology brush on the LUL nodule/mass were performed, the brushes were cut into CytoLyt preservative.  ROSE off the brushings also showed intense inflammatory reaction.  A total of 2 transbronchial needle aspirates were performed of the left upper lobe mass.  1 needle aspirate sent for culture and a second needle aspirate for cytology.  Once this sampling was completed a targeted BAL of the left upper lobe was obtained.  For BAL sample acquisition purposes, 40 ml of normal saline were instilled, and approximately 8 mL ml were recovered/trapped and sent for analysis (cultures).   The robotic bronchoscope was then retracted all the way out after confirmation of excellent hemostasis.  An Olympus endobronchial ultrasound was then introduced into the airway.  Examination of the mediastinum did not show any abnormal adenopathy.  Areas examined were precarinal station, subcarinal stations, and both hilar stations.once this was completed, the  patient received bronchial lavage with 9 mL of 1% lidocaine via the ET tube.  And that the endobronchial ultrasound scope was removed.  The patient tolerated the procedure well. No significant bleeding was observed at the conclusion of the procedure.  At this point, the patient was allowed to emerge from general anesthesia, and was extubated in the procedure room without incident.  The patient  was taken to the PACU in satisfactory condition.  Auscultation of the lungs showed no change from pre bronchoscopy examination has far as symmetry.  Cough and wheezes were noted and the patient received DuoNeb.  The patient tolerated the procedure well with no untoward effects of anesthesia noted.   Specimens Obtained:  Transbronchial Forceps Biopsy: X 10, LUL  Transbronchial Brush: X 4, LUL  Transbronchial needle aspirate: X 2, left upper lobe  Targeted BAL: 8 mL.  Fluoroscopy: Augmented fluoroscopy ([employer]) was utilized during the course of this procedure to assure that biopsies were taken in a safe manner under fluoroscopic guidance with spot films required.  Total fluoroscopy time: 5 minutes 33 seconds, total fluoroscopy dose 28.69 mGy.  Intraoperative images:  Robotic bronchoscope at target:   Robotic bronchoscope bronchoscope repositioned at denser area of target:   Robotic bronchoscope at target:   During brushings, tool in lesion:   Cellvizio images showing a edge of mass (yellow arrow) and areas of necrosis that appear as a "void" (blue arrow):   Abnormal tissue pattern at target lesion:   Complications:None, no pneumothorax on post film:   Estimated Blood Loss: Nil    Assessment and Plan/Additional Comments: Left upper lobe mass rule out cancer versus infectious process Status post technically successful robotic assisted bronchoscopy Await pathology and microbiology reports Patient has appropriate pulmonary and oncology follow-ups     C. Danice Goltz,  MD Advanced Bronchoscopy PCCM [facility]    *This note was dictated using voice recognition software/Dragon.  Despite best efforts to proofread, errors can occur which  can change the meaning.  Any change was purely unintentional.

## 2022-08-07 NOTE — Anesthesia Procedure Notes (Addendum)
Procedure Name: Intubation Date/Time: 08/07/2022 12:55 PM  Performed by: Renee Ramus, CRNAPre-anesthesia Checklist: Patient identified, Emergency Drugs available, Suction available and Patient being monitored Patient Re-evaluated:Patient Re-evaluated prior to induction Oxygen Delivery Method: Circle system utilized Preoxygenation: Pre-oxygenation with 100% oxygen Induction Type: IV induction Ventilation: Mask ventilation without difficulty Laryngoscope Size: McGraph and 3 Grade View: Grade I Tube type: Oral Tube size: 8.0 mm Number of attempts: 1 Airway Equipment and Method: Stylet and Oral airway Placement Confirmation: ETT inserted through vocal cords under direct vision, positive ETCO2 and breath sounds checked- equal and bilateral Secured at: 22 cm Tube secured with: Tape Dental Injury: Teeth and Oropharynx as per pre-operative assessment

## 2022-08-07 NOTE — Transfer of Care (Signed)
Immediate Anesthesia Transfer of Care Note  Patient: Kylie Washington  Procedure(s) Performed: ROBOTIC ASSISTED NAVIGATIONAL BRONCHOSCOPY (Left) ENDOBRONCHIAL ULTRASOUND (Left)  Patient Location: PACU  Anesthesia Type:General  Level of Consciousness: awake  Airway & Oxygen Therapy: Patient Spontanous Breathing and Patient connected to face mask oxygen  Post-op Assessment: Report given to RN and Post -op Vital signs reviewed and stable  Post vital signs: Reviewed and stable  Last Vitals:  Vitals Value Taken Time  BP 146/81 08/07/22 1430  Temp    Pulse 79 08/07/22 1434  Resp    SpO2 96 % 08/07/22 1434  Vitals shown include unvalidated device data.  Last Pain:  Vitals:   08/07/22 1130  TempSrc: Temporal  PainSc: 5          Complications: No notable events documented.

## 2022-08-07 NOTE — Discharge Instructions (Signed)
AMBULATORY SURGERY  ?DISCHARGE INSTRUCTIONS ? ? ?The drugs that you were given will stay in your system until tomorrow so for the next 24 hours you should not: ? ?Drive an automobile ?Make any legal decisions ?Drink any alcoholic beverage ? ? ?You may resume regular meals tomorrow.  Today it is better to start with liquids and gradually work up to solid foods. ? ?You may eat anything you prefer, but it is better to start with liquids, then soup and crackers, and gradually work up to solid foods. ? ? ?Please notify your doctor immediately if you have any unusual bleeding, trouble breathing, redness and pain at the surgery site, drainage, fever, or pain not relieved by medication. ? ? ? ?Additional Instructions: ? ? ? ?Please contact your physician with any problems or Same Day Surgery at [phone], Monday through Friday 6 am to 4 pm, or [place] [facility] [place] Main number at [phone].  ?

## 2022-08-07 NOTE — Anesthesia Preprocedure Evaluation (Addendum)
Anesthesia Evaluation  Patient identified by MRN, date of birth, ID band Patient awake    Reviewed: Allergy & Precautions, NPO status , Patient's Chart, lab work & pertinent test results  History of Anesthesia Complications Negative for: history of anesthetic complications  Airway Mallampati: III   Neck ROM: Full    Dental  (+) Missing   Pulmonary Current Smoker (5 cigarettes per day) and Patient abstained from smoking. Lung CA    + decreased breath sounds (left worse than right)+ wheezing      Cardiovascular + CAD and + Peripheral Vascular Disease (carotid disease)  Normal cardiovascular exam Rhythm:Regular Rate:Normal  ECG 08/03/22: normal   Neuro/Psych  PSYCHIATRIC DISORDERS  Depression    negative neurological ROS     GI/Hepatic ,GERD  ,,  Endo/Other  Hypothyroidism    Renal/GU Renal disease (stage III CKD)     Musculoskeletal  (+) Arthritis  (psoriatic),  Skin CA   Abdominal   Peds  Hematology   Anesthesia Other Findings   Reproductive/Obstetrics                             Anesthesia Physical Anesthesia Plan  ASA: 3  Anesthesia Plan: General   Post-op Pain Management:    Induction: Intravenous  PONV Risk Score and Plan: 2 and Ondansetron, Dexamethasone and Treatment may vary due to age or medical condition  Airway Management Planned: Oral ETT  Additional Equipment:   Intra-op Plan:   Post-operative Plan: Extubation in OR  Informed Consent: I have reviewed the patients History and Physical, chart, labs and discussed the procedure including the risks, benefits and alternatives for the proposed anesthesia with the patient or authorized representative who has indicated his/her understanding and acceptance.     Dental advisory given  Plan Discussed with: CRNA  Anesthesia Plan Comments: (Patient consented for risks of anesthesia including but not limited to:  - adverse  reactions to medications - damage to eyes, teeth, lips or other oral mucosa - nerve damage due to positioning  - sore throat or hoarseness - damage to heart, brain, nerves, lungs, other parts of body or loss of life  Informed patient about role of CRNA in peri- and intra-operative care.  Patient voiced understanding.)        Anesthesia Quick Evaluation

## 2022-08-07 NOTE — Interval H&P Note (Signed)
Kylie Washington  has presented today for surgery, with the diagnosis of LEFT UPPER LOBE LUNG MASS.  The various methods of treatment have been discussed with the patient and family. After consideration of risks, benefits and other options for treatment, the patient has consented to  Procedure(s): ROBOTIC ASSISTED NAVIGATIONAL BRONCHOSCOPY AND ENDOBRONCHIAL ULTRASOUND-LEFT/medial as a surgical intervention.  The patient's history has been reviewed, patient examined, no change in status, stable for surgery.  I have reviewed the patient's chart and labs.  Questions were answered to the patient's satisfaction.  Benefits, limitations and potential complications of the procedure were discussed with the patient/family.  Complications from bronchoscopy are rare and most often minor, but if they occur they may include breathing difficulty, vocal cord spasm, hoarseness, slight fever, vomiting, dizziness, bronchospasm, infection, low blood oxygen, bleeding from biopsy site, or an allergic reaction to medications.  It is uncommon for patients to experience other more serious complications for example: Collapsed lung requiring chest tube placement, respiratory failure, heart attack and/or cardiac arrhythmia.  As previously discussed patient understands potential higher risk for pneumothorax due to inability to the pleural playing clearly.  However, patient's other option is open biopsy which she is not willing to entertain.  She has had multiple failed biopsies previously.  Gailen Shelter, MD Advanced Bronchoscopy PCCM [facility]    *This note was dictated using voice recognition software/Dragon.  Despite best efforts to proofread, errors can occur which can change the meaning. Any transcriptional errors that result from this process are unintentional and may not be fully corrected at the time of dictation.

## 2022-08-08 ENCOUNTER — Encounter: Payer: Self-pay | Admitting: Pulmonary Disease

## 2022-08-08 LAB — CULTURE, FUNGUS WITHOUT SMEAR: Culture: NO GROWTH

## 2022-08-08 LAB — CULTURE, BAL-QUANTITATIVE W GRAM STAIN: Culture: NO GROWTH

## 2022-08-08 NOTE — Anesthesia Postprocedure Evaluation (Signed)
Anesthesia Post Note  Patient: Amere Iott  Procedure(s) Performed: ROBOTIC ASSISTED NAVIGATIONAL BRONCHOSCOPY (Left) ENDOBRONCHIAL ULTRASOUND (Left)  Patient location during evaluation: PACU Anesthesia Type: General Level of consciousness: awake and alert, oriented and patient cooperative Pain management: pain level controlled Vital Signs Assessment: post-procedure vital signs reviewed and stable Respiratory status: spontaneous breathing, nonlabored ventilation and respiratory function stable Cardiovascular status: blood pressure returned to baseline and stable Postop Assessment: adequate PO intake Anesthetic complications: no   No notable events documented.   Last Vitals:  Vitals:   08/07/22 1515 08/07/22 1543  BP: 125/72 132/72  Pulse: 76 71  Resp: 14 17  Temp:  36.8 C  SpO2: 96% 97%    Last Pain:  Vitals:   08/07/22 1543  TempSrc: Temporal  PainSc: 0-No pain                 Reed Breech

## 2022-08-09 LAB — ACID FAST SMEAR (AFB, MYCOBACTERIA): Acid Fast Smear: NEGATIVE

## 2022-08-09 LAB — CYTOLOGY - NON PAP

## 2022-08-09 LAB — SURGICAL PATHOLOGY

## 2022-08-10 ENCOUNTER — Other Ambulatory Visit: Payer: Self-pay | Admitting: *Deleted

## 2022-08-10 ENCOUNTER — Telehealth: Payer: Self-pay | Admitting: *Deleted

## 2022-08-10 DIAGNOSIS — R222 Localized swelling, mass and lump, trunk: Secondary | ICD-10-CM

## 2022-08-10 DIAGNOSIS — R918 Other nonspecific abnormal finding of lung field: Secondary | ICD-10-CM

## 2022-08-10 DIAGNOSIS — M546 Pain in thoracic spine: Secondary | ICD-10-CM

## 2022-08-10 DIAGNOSIS — L0231 Cutaneous abscess of buttock: Secondary | ICD-10-CM

## 2022-08-10 NOTE — Telephone Encounter (Signed)
Dr. Smith Robert wanted me to get ref for the pt to Dr. Dorris Fetch and I sent ref. today

## 2022-08-11 LAB — ASPERGILLUS ANTIGEN, BAL/SERUM: Aspergillus Ag, BAL/Serum: 0.2 Index (ref 0.00–0.49)

## 2022-08-14 ENCOUNTER — Encounter: Payer: Self-pay | Admitting: Surgery

## 2022-08-14 ENCOUNTER — Ambulatory Visit: Payer: Medicare PPO | Admitting: Surgery

## 2022-08-14 ENCOUNTER — Telehealth: Payer: Self-pay | Admitting: Surgery

## 2022-08-14 ENCOUNTER — Inpatient Hospital Stay: Payer: Medicare PPO | Admitting: Oncology

## 2022-08-14 VITALS — BP 115/80 | HR 83 | Temp 99.0°F | Ht 66.0 in | Wt 135.0 lb

## 2022-08-14 DIAGNOSIS — R222 Localized swelling, mass and lump, trunk: Secondary | ICD-10-CM | POA: Diagnosis not present

## 2022-08-14 DIAGNOSIS — G8929 Other chronic pain: Secondary | ICD-10-CM | POA: Diagnosis not present

## 2022-08-14 NOTE — Patient Instructions (Signed)
You have requested to have an excision of your skin lesion. This will be done by Dr Everlene Farrier at [facility]. Please see your (BLUE) Pre-care sheet for more information. Our surgery scheduler will call you to look at surgery dates and to go over surgery information.    Excision of Skin Lesions Excision of a skin lesion is the removal of a section of skin by making small incisions in the skin. Through this process, the lesion is completely removed. This procedure is often done to treat or prevent cancer or infection. It may also be done to improve cosmetic appearance. You may have this procedure to remove: Cancerous (malignant) growths, such as basal cell carcinoma, squamous cell carcinoma, or melanoma. Noncancerous (benign) growths, such as a cyst or lipoma. Growths, such as moles or skin tags, which may be removed for cosmetic reasons. Various excision or surgical techniques may be used depending on your condition, the location of the lesion, and your overall health. Tell your health care provider about: Any allergies you have. All medicines you are taking, including vitamins, herbs, eye drops, creams, and over-the-counter medicines. Any problems you or family members have had with anesthetic medicines. Any bleeding problems you have. Any surgeries you have had. Any medical conditions you have. Whether you are pregnant or may be pregnant. What are the risks? Generally, this is a safe procedure. However, problems may occur, including: Bleeding. Infection. Scarring. Recurrence of the cyst, lipoma, or cancer. Allergic reaction to anesthetics, surgical materials, or ointments. Damage to nerves, blood vessels, muscles, or other structures. What happens before the procedure? Medicines Ask your health care provider about: Changing or stopping your regular medicines. This is especially important if you are taking diabetes medicines or blood thinners. Taking medicines such as aspirin and ibuprofen. These  medicines can thin your blood. Do not take these medicines unless your health care provider tells you to take them. Taking over-the-counter medicines, vitamins, herbs, and supplements. General instructions Do not use any products that contain nicotine or tobacco. These products include cigarettes, chewing tobacco, and vaping devices, such as e-cigarettes. If you need help quitting, ask your health care provider. Follow instructions from your health care provider about eating or drinking restrictions. Ask your health care provider: How your surgery site will be marked. What steps will be taken to help prevent infection. These steps may include: Removing hair at the surgery site. Washing skin with a germ-killing soap. Taking antibiotic medicine. Ask your health care provider if you will need someone to take you home from the hospital or clinic after the procedure. What happens during the procedure?  You will be given a medicine to numb the area (local anesthetic). Your health care provider will remove the lesions using one of the following excision techniques. Complete surgical excision. This procedure may be done to treat a cancerous growth or a noncancerous cyst or lesion. A small scalpel or scissors will be used to gently cut around and under the lesion until it is completely removed. If bleeding occurs, it will be stopped with a device that delivers heat (electrocautery). The edges of the wound may be stitched (sutured) together. A bandage (dressing) will be applied. Samples will be sent to a lab for testing. Excision of a cyst. An incision will be made on the cyst. The entire cyst will be removed through the incision. The incision may be closed with sutures. Shave excision. This may be done to remove a mole or other small growths. A small blade or scalpel  will be used to shave off the lesion. The wound is usually left to heal on its own without sutures. The sample may be sent to a lab  for testing. Punch excision. This may be done to completely remove a mole or other small growths. A small tool that is like a cookie cutter or a hole punch is used to cut a circle shape out of the skin. The outer edges of the skin will be sutured together. The sample may be sent to a lab for testing. Mohs micrographic surgery. This is usually done to treat skin cancer. This type of excision is mostly used on the face and ears. This procedure is minimally invasive, and it ensures the best cosmetic outcome. A scalpel or a loop instrument will be used to remove layers of the lesion until all the abnormal or cancerous tissue has been removed. The wound may be sutured, depending on its size. The tissue will be checked under a microscope right away. The procedure may vary among health care providers and hospitals. At the end of any of these procedures, antibiotic ointment will be applied as needed. What happens after the procedure? Talk with your health care provider to discuss any test results, treatment options, and if necessary, the need for more tests. Keep all follow-up visits. This is important. Summary Excision of a skin lesion is the removal of a section of skin by making small incisions in the skin. This procedure is often done to treat or prevent skin cancer, remove benign growths, or it may be done to improve cosmetic appearance. Various excision or surgical techniques may be used depending on your condition, the location of the lesion, and your overall health. After the procedure, talk with your health care provider to discuss any test results, treatment options, and if necessary, the need for more tests. Keep all follow-up visits. This is important. This information is not intended to replace advice given to you by your health care provider. Make sure you discuss any questions you have with your health care provider. Document Revised: 10/26/2020 Document Reviewed: 10/26/2020 Elsevier  Patient Education  2023 [employer].

## 2022-08-14 NOTE — Telephone Encounter (Signed)
Patient has been advised of Pre-Admission date/time, and Surgery date at [facility].  Surgery Date: 08/22/22 Preadmission Testing Date: 08/17/22 (phone 1p-4p)  Patient has been made aware to call [phone], between 1-3:00pm the day before surgery, to find out what time to arrive for surgery.

## 2022-08-16 ENCOUNTER — Other Ambulatory Visit: Payer: Medicare PPO

## 2022-08-16 NOTE — Progress Notes (Signed)
Surgical Consultation  08/16/2022  Wylda Cosgrove is an 82 y.o. female.   Chief Complaint  Patient presents with   Follow-up     HPI:  Dawanna Lensch is a 82 y.o. female Following up For a left gluteal mass.  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  Is causing some mass effect upon the cord.  Had multiple biopsies to include mediastinal biopsy as well as biopsy of the left buttocks without a definitive diagnosis.  He has had severe back pain for a few months and has noticed that over the last couple months had a left gluteal mass.  She states that at some point in time it drained and this was shortly after the biopsy.  Denies any fevers any chills it is uncomfortable but her main complaint is back pain. Did have a PET/CT that have personally reviewed showing a hypermetabolic left upper lobe and paraspinal mass involving the spinal processes as well as hypermetabolic mediastinal nodes and a left gluteal mass.  She has a history of skin cancer.  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  Recent bronchoscopy not completely diagnostic but suspicious for malignancy . D/w Dr. Smith Robert and Dr. Jayme Cloud and they recommend the need for for tissue     Past Medical History:  Diagnosis Date   Bilateral carotid artery disease (HCC)    Cancer (HCC) 04/22/2020   lung    Cataract cortical, senile, bilateral    Coronary artery disease    Hypercholesteremia    Hypothyroidism    Psoriasis    Psoriatic arthritis (HCC)    Skin cancer    about 3 weeks she has cream to slough the cancer  off her left cheek   Stage 3a chronic kidney disease (HCC)    Tooth abscess 05/31/2020    Past Surgical History:  Procedure Laterality Date   CARPAL TUNNEL RELEASE Left 1988    CATARACT EXTRACTION, BILATERAL  1997   COLONOSCOPY  2008   COLONOSCOPY  2018   ENDOBRONCHIAL ULTRASOUND Left 08/07/2022   Procedure: ENDOBRONCHIAL ULTRASOUND;  Surgeon: Salena Saner, MD;  Location: [facility] ORS;  Service: Pulmonary;  Laterality: Left;   LIVER BIOPSY  1986   STRABISMUS SURGERY Left 1997   TONSILLECTOMY AND ADENOIDECTOMY  1945   VAGINAL HYSTERECTOMY      Family History  Problem Relation Age of Onset   Diabetes Mother    Diabetes Sister    Cancer Maternal Uncle    Cancer Paternal Uncle     Social History:  reports that she has been smoking cigarettes. She has a 12.25 pack-year smoking history. She has never used smokeless tobacco. She reports that she does not currently use alcohol. She reports that she does not use drugs.  Allergies:  Allergies  Allergen Reactions   Aurothioglucose Other (See Comments)    Granulocytopenia   Codeine Nausea Only and Nausea And Vomiting   Methotrexate Derivatives     Liver enlargement   Sulfa Antibiotics Nausea Only    Medications reviewed.     ROS Full ROS performed and is otherwise negative other than what is stated in the HPI    BP 115/80   Pulse 83   Temp 99 F (37.2 C)   Ht  5\' 6"  (1.676 m)   Wt 135 lb (61.2 kg)   SpO2 100%   BMI 21.79 kg/m   Physical Exam Chaperone present CONSTITUTIONAL: NAD. EYES: Pupils are equal, round,  Sclera are non-icteric. EARS, NOSE, MOUTH AND THROAT:  The oral mucosa is pink and moist. Hearing is intact to voice. LYMPH NODES:  Lymph nodes in the neck are normal. RESPIRATORY:  Lungs are clear. There is normal respiratory effort, with equal breath sounds bilaterally, and without pathologic use of accessory muscles. CARDIOVASCULAR: Heart is regular without murmurs, gallops, or rubs. GI: The abdomen is  soft, nontender, and nondistended. There are no palpable masses. There is no hepatosplenomegaly. There are normal bowel sounds in all quadrants. GU: Rectal deferred.   Large  gluteal mass measuring 6 x 8 cm on the left side.  There is no evidence of fluctuance there is no evidence of erythema there is no evidence of tenderness.  This is consistent with a mass within the soft tissue and adjacent to the muscle.  This is not an abscess MUSCULOSKELETAL: Normal muscle strength and tone. No cyanosis or edema.   SKIN: There is a skin lesion left cheek covered by bandaid NEUROLOGIC: Motor and sensation is grossly normal. Cranial nerves are grossly intact. PSYCH:  Oriented to person, place and time. Affect is normal.   Assessment/Plan: 43.-year-old female with posterior mediastinal mass involving thoracic root and severe chronic pain.  In addition she has multiple mediastinal lymphadenopathy as well as a left large gluteal mass.  I do not think it is an active infection.  This does not look like a classic gluteal abscess.  I am concerned potentially about being a metastatic lesion.  Given the uncertainty of her w/u and the need for further tissue  I do recommend excision of gluteal lesion. Discussed With the patient and the family in detail about her disease process.    Discussed with her in detail about doing an excisional biopsy for diagnostic purposes. The now agree and wish to proceed. Procedure d/w them in detail risks, benefits and possible complications including but not limited to bleeding, infection, pain, deformity , nerve injury. They understand and wish to proceed. I spent 40 minutes in this encounter including personally reviewing images, extensive review of medical records, coordinating her care, placing orders and performing appropriate mentation   Sterling Big, MD [facility] General Surgeon

## 2022-08-16 NOTE — H&P (View-Only) (Signed)
Surgical Consultation  08/16/2022  Kylie Washington is an 82 y.o. female.   Chief Complaint  Patient presents with   Follow-up     HPI:  Kylie Washington is a 82 y.o. female Following up For a left gluteal mass.  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  Is causing some mass effect upon the cord.  Had multiple biopsies to include mediastinal biopsy as well as biopsy of the left buttocks without a definitive diagnosis.  He has had severe back pain for a few months and has noticed that over the last couple months had a left gluteal mass.  She states that at some point in time it drained and this was shortly after the biopsy.  Denies any fevers any chills it is uncomfortable but her main complaint is back pain. Did have a PET/CT that have personally reviewed showing a hypermetabolic left upper lobe and paraspinal mass involving the spinal processes as well as hypermetabolic mediastinal nodes and a left gluteal mass.  She has a history of skin cancer.  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  Recent bronchoscopy not completely diagnostic but suspicious for malignancy . D/w Dr. Rao and Dr. Gonzalez and they recommend the need for for tissue     Past Medical History:  Diagnosis Date   Bilateral carotid artery disease (HCC)    Cancer (HCC) 04/22/2020   lung    Cataract cortical, senile, bilateral    Coronary artery disease    Hypercholesteremia    Hypothyroidism    Psoriasis    Psoriatic arthritis (HCC)    Skin cancer    about 3 weeks she has cream to slough the cancer  off her left cheek   Stage 3a chronic kidney disease (HCC)    Tooth abscess 05/31/2020    Past Surgical History:  Procedure Laterality Date   CARPAL TUNNEL RELEASE Left 1988    CATARACT EXTRACTION, BILATERAL  1997   COLONOSCOPY  2008   COLONOSCOPY  2018   ENDOBRONCHIAL ULTRASOUND Left 08/07/2022   Procedure: ENDOBRONCHIAL ULTRASOUND;  Surgeon: Gonzalez, Carmen L, MD;  Location: [facility] ORS;  Service: Pulmonary;  Laterality: Left;   LIVER BIOPSY  1986   STRABISMUS SURGERY Left 1997   TONSILLECTOMY AND ADENOIDECTOMY  1945   VAGINAL HYSTERECTOMY      Family History  Problem Relation Age of Onset   Diabetes Mother    Diabetes Sister    Cancer Maternal Uncle    Cancer Paternal Uncle     Social History:  reports that she has been smoking cigarettes. She has a 12.25 pack-year smoking history. She has never used smokeless tobacco. She reports that she does not currently use alcohol. She reports that she does not use drugs.  Allergies:  Allergies  Allergen Reactions   Aurothioglucose Other (See Comments)    Granulocytopenia   Codeine Nausea Only and Nausea And Vomiting   Methotrexate Derivatives     Liver enlargement   Sulfa Antibiotics Nausea Only    Medications reviewed.     ROS Full ROS performed and is otherwise negative other than what is stated in the HPI    BP 115/80   Pulse 83   Temp 99 F (37.2 C)   Ht   5' 6" (1.676 m)   Wt 135 lb (61.2 kg)   SpO2 100%   BMI 21.79 kg/m   Physical Exam Chaperone present CONSTITUTIONAL: NAD. EYES: Pupils are equal, round,  Sclera are non-icteric. EARS, NOSE, MOUTH AND THROAT:  The oral mucosa is pink and moist. Hearing is intact to voice. LYMPH NODES:  Lymph nodes in the neck are normal. RESPIRATORY:  Lungs are clear. There is normal respiratory effort, with equal breath sounds bilaterally, and without pathologic use of accessory muscles. CARDIOVASCULAR: Heart is regular without murmurs, gallops, or rubs. GI: The abdomen is  soft, nontender, and nondistended. There are no palpable masses. There is no hepatosplenomegaly. There are normal bowel sounds in all quadrants. GU: Rectal deferred.   Large  gluteal mass measuring 6 x 8 cm on the left side.  There is no evidence of fluctuance there is no evidence of erythema there is no evidence of tenderness.  This is consistent with a mass within the soft tissue and adjacent to the muscle.  This is not an abscess MUSCULOSKELETAL: Normal muscle strength and tone. No cyanosis or edema.   SKIN: There is a skin lesion left cheek covered by bandaid NEUROLOGIC: Motor and sensation is grossly normal. Cranial nerves are grossly intact. PSYCH:  Oriented to person, place and time. Affect is normal.   Assessment/Plan: 82.-year-old female with posterior mediastinal mass involving thoracic root and severe chronic pain.  In addition she has multiple mediastinal lymphadenopathy as well as a left large gluteal mass.  I do not think it is an active infection.  This does not look like a classic gluteal abscess.  I am concerned potentially about being a metastatic lesion.  Given the uncertainty of her w/u and the need for further tissue  I do recommend excision of gluteal lesion. Discussed With the patient and the family in detail about her disease process.    Discussed with her in detail about doing an excisional biopsy for diagnostic purposes. The now agree and wish to proceed. Procedure d/w them in detail risks, benefits and possible complications including but not limited to bleeding, infection, pain, deformity , nerve injury. They understand and wish to proceed. I spent 40 minutes in this encounter including personally reviewing images, extensive review of medical records, coordinating her care, placing orders and performing appropriate mentation   Shivaay Stormont, MD FACS General Surgeon  

## 2022-08-17 ENCOUNTER — Encounter
Admission: RE | Admit: 2022-08-17 | Discharge: 2022-08-17 | Disposition: A | Discharge status: Home / Self Care | Payer: Medicare PPO | Source: Ambulatory Visit | Attending: Surgery | Admitting: Surgery

## 2022-08-17 ENCOUNTER — Other Ambulatory Visit: Payer: Self-pay

## 2022-08-17 ENCOUNTER — Encounter: Payer: Self-pay | Admitting: Pulmonary Disease

## 2022-08-17 ENCOUNTER — Ambulatory Visit: Payer: Medicare PPO | Admitting: Pulmonary Disease

## 2022-08-17 VITALS — BP 110/78 | HR 96 | Temp 97.7°F | Ht 66.0 in | Wt 135.0 lb

## 2022-08-17 DIAGNOSIS — K5903 Drug induced constipation: Secondary | ICD-10-CM

## 2022-08-17 DIAGNOSIS — L922 Granuloma faciale [eosinophilic granuloma of skin]: Secondary | ICD-10-CM | POA: Diagnosis not present

## 2022-08-17 DIAGNOSIS — T402X5A Adverse effect of other opioids, initial encounter: Secondary | ICD-10-CM

## 2022-08-17 DIAGNOSIS — J441 Chronic obstructive pulmonary disease with (acute) exacerbation: Secondary | ICD-10-CM

## 2022-08-17 DIAGNOSIS — D381 Neoplasm of uncertain behavior of trachea, bronchus and lung: Secondary | ICD-10-CM | POA: Diagnosis not present

## 2022-08-17 HISTORY — DX: Anxiety disorder, unspecified: F41.9

## 2022-08-17 HISTORY — DX: Dyspnea, unspecified: R06.00

## 2022-08-17 LAB — CULTURE, FUNGUS WITHOUT SMEAR

## 2022-08-17 MED ORDER — METHYLPREDNISOLONE 4 MG PO TBPK: ORAL_TABLET | ORAL | 0 refills | Status: DC

## 2022-08-17 MED ORDER — TRELEGY ELLIPTA 100-62.5-25 MCG/ACT IN AEPB: 1.0000 | INHALATION_SPRAY | Freq: Every day | RESPIRATORY_TRACT | 11 refills | Status: DC

## 2022-08-17 MED ORDER — AZITHROMYCIN 250 MG PO TABS: ORAL_TABLET | ORAL | 0 refills | Status: AC

## 2022-08-17 MED ORDER — IPRATROPIUM-ALBUTEROL 0.5-2.5 (3) MG/3ML IN SOLN
3.0000 mL | Freq: Once | RESPIRATORY_TRACT | Status: AC
Start: 2022-08-17 — End: 2022-08-17
  Administered 2022-08-17: 3 mL via RESPIRATORY_TRACT

## 2022-08-17 MED ORDER — ALBUTEROL SULFATE HFA 108 (90 BASE) MCG/ACT IN AERS: 2.0000 | INHALATION_SPRAY | Freq: Four times a day (QID) | RESPIRATORY_TRACT | 2 refills | Status: DC | PRN

## 2022-08-17 MED ORDER — TRELEGY ELLIPTA 100-62.5-25 MCG/ACT IN AEPB: 1.0000 | INHALATION_SPRAY | Freq: Every day | RESPIRATORY_TRACT | 0 refills | Status: DC

## 2022-08-17 NOTE — Patient Instructions (Addendum)
We should have the results of your blood test for about 7 to 10 days.  I will let you know what this shows.  I have sent prescriptions to treat your COPD exacerbation Methylprednisolone (Medrol) this is a Dosepak.  This is an anti-inflammatory medication that should help with the wheezing and your breathing.  It will also boost your appetite some.  Follow the directions in the package it will tell you how to dose it. Azithromycin (Z-Pak) this is an antibiotic that you will take daily.  Follow the directions in the package.  We have provided you with an inhaler called Trelegy this is 1 puff daily.  You can take 1 dose this evening.  Make sure you rinse your mouth well after you use it.  You can take it at the same time every day.  The sample we provided you with will last approximately 2 weeks and there will be a prescription in your pharmacy for the inhaler.  We have sent a prescription to your pharmacy called albuterol (ProAir, Ventolin) this is an emergency inhaler that you can use up to 4 times a day if you are short of breath or have wheezing.  For your constipation you can get over-the-counter Senokot which is a laxative and stool softener you can take this up to twice a day.  If that does not relief you can get MiraLAX also over-the-counter and that should help.  Stay well-hydrated as this will help with keeping all of your other functions regular.  We will see you in follow-up in 3 to 4 weeks time call sooner should any new problems arise.  As stated, we will call you as soon as we know the results of the blood test.

## 2022-08-17 NOTE — Patient Instructions (Addendum)
Your procedure is scheduled on: 08/22/22 - Tuesday Report to the Registration Desk on the 1st floor of the [facility]. To find out your arrival time, please call [phone] between 1PM - 3PM on: 08/21/22 - Monday If your arrival time is 6:00 am, do not arrive before that time as the [facility] entrance doors do not open until 6:00 am.  REMEMBER: Instructions that are not followed completely may result in serious medical risk, up to and including death; or upon the discretion of your surgeon and anesthesiologist your surgery may need to be rescheduled.  Do not eat food or drink any liquid after midnight the night before surgery.  No gum chewing or hard candies.   TAKE ONLY THESE MEDICATIONS THE MORNING OF SURGERY WITH A SIP OF WATER:  Levothyroxine (SYNTHROID  albuterol (VENTOLIN HFA)  TRELEGY ELLIPTA  methylPREDNISolone   No Alcohol for 24 hours before or after surgery.  No Smoking including e-cigarettes for 24 hours before surgery.  No chewable tobacco products for at least 6 hours before surgery.  No nicotine patches on the day of surgery.  Do not use any "recreational" drugs for at least a week (preferably 2 weeks) before your surgery.  Please be advised that the combination of cocaine and anesthesia may have negative outcomes, up to and including death. If you test positive for cocaine, your surgery will be cancelled.  On the morning of surgery brush your teeth with toothpaste and water, you may rinse your mouth with mouthwash if you wish. Do not swallow any toothpaste or mouthwash.  Do not wear jewelry, make-up, hairpins, clips or nail polish.  Do not wear lotions, powders, or perfumes.   Do not shave body hair from the neck down 48 hours before surgery.  Contact lenses, hearing aids and dentures may not be worn into surgery.  Do not bring valuables to the hospital. [facility] is not responsible for any missing/lost belongings or valuables.   Notify your doctor  if there is any change in your medical condition (cold, fever, infection).  Wear comfortable clothing (specific to your surgery type) to the hospital.  After surgery, you can help prevent lung complications by doing breathing exercises.  Take deep breaths and cough every 1-2 hours. Your doctor may order a device called an Incentive Spirometer to help you take deep breaths. When coughing or sneezing, hold a pillow firmly against your incision with both hands. This is called "splinting." Doing this helps protect your incision. It also decreases belly discomfort.  If you are being admitted to the hospital overnight, leave your suitcase in the car. After surgery it may be brought to your room.  In case of increased patient census, it may be necessary for you, the patient, to continue your postoperative care in the Same Day Surgery department.  If you are being discharged the day of surgery, you will not be allowed to drive home. You will need a responsible individual to drive you home and stay with you for 24 hours after surgery.   If you are taking public transportation, you will need to have a responsible individual with you.  Please call the Pre-admissions Testing Dept. at [phone] if you have any questions about these instructions.  Surgery Visitation Policy:  Patients having surgery or a procedure may have two visitors.  Children under the age of 82 must have an adult with them who is not the patient.  Inpatient Visitation:    Visiting hours are 7 a.m.  to 8 p.m. Up to four visitors are allowed at one time in a patient room. The visitors may rotate out with other people during the day.  One visitor age 82 or older may stay with the patient overnight and must be in the room by 8 p.m.

## 2022-08-17 NOTE — Progress Notes (Signed)
Subjective:    Patient ID: Kylie Washington, female    DOB: 11-27-1940, 82 y.o.   MRN: [id number] Patient Care Team: Gracelyn Nurse, MD as PCP - General (Internal Medicine) Glory Buff, RN as Oncology Nurse Navigator Salena Saner, MD as Consulting Physician (Pulmonary Disease) Creig Hines, MD as Consulting Physician (Oncology)  Chief Complaint  Patient presents with   Follow-up    Nodule. SOB. Wheezing. Cough with yellow sputum.   HPI The patient is an 82 year old current smoker with a 49-pack-year history of smoking and a history as noted below who presents after robotic assisted navigational bronchoscopy for diagnosis of a left upper lobe mass.  She underwent robotic assisted navigational bronchoscopy on 07 August 2022.  The biopsies of the procedure showed a lot of necroinflammatory debris with small cluster suspicious of malignancy and particularly concerning for squamous cell carcinoma.  However a definitive diagnosis could not be made.  Patient also had cultures and so far these have been negative including fungal cultures.  Recall that the patient has been noted to have a left upper lobe process for at least 2 years. Initially presented with a subpleural mass in the posterior medial left upper lobe measuring 5.4 x 3.2 x 7.9 cm extending into the T2-T3, T3-T4 and T4-T5 neural foramina. There was mass effect upon the cord. Because of the involvement on the spinal cord the patient was referred to radiation oncology and was started on steroids this was in January 2022. She then went for biopsy of this mass but significant decrease in the size of the mass was noted. Nevertheless biopsy obtained at that time via percutaneous means showed only fibrosing changes and no diagnosis of malignancy was made. Patient then started having back pain as prior in March 2024. This was more marked around the T6 vertebral region. MRI showed a left paraspinal mass with infiltration between T2 and T5  neural foramina causing mass effect. A PET/CT scan was then procured and this showed an enlarging hypermetabolic left upper lobe/paraspinal mass consistent with tumor progression invading the neural foramina T2-3, T3-4 and T4-5. There was also small but hypermetabolic mediastinal and bilateral hilar lymph nodes suggesting potential metastatic adenopathy. There was a new hypermetabolic skin lesion involving the left cheek area.  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  And a 4.7 cm cystic appearing lesion in the subcutaneous fat overlying the gluteus maximus. The patient had biopsy of the gluteus maximus lesion on 14 July 2022 and this was non-diagnostic.   Today the patient is here with her daughter.  There is another daughter listening in via phone.  I discussed with the patient that I am really concerned about squamous cell carcinoma.  The tumor is very necrotic and this has caused the difficulty in making a definitive diagnosis.  The patient today does note that she has been having shortness of breath that is made worse by oxycodone and has noted wheezing.  She has had cough productive of yellowish sputum.  She has not had any fevers, chills or sweats.  No hemoptysis.  She has noted weight loss and anorexia.  Review of Systems A 10 point review of systems was performed and it is as noted above otherwise negative.  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   Mass of upper lobe of left lung 06/14/2020   Stage 3a chronic kidney disease (HCC) 06/14/2020   Other  specified hypothyroidism 05/29/2017   Bilateral carotid artery disease (HCC) 04/23/2014   Current tobacco use 04/23/2014   Psoriatic arthritis (HCC) 03/03/2014   Hypercholesterolemia 03/03/2014   Graves disease 03/03/2014   Social History   Tobacco Use   Smoking status: Every Day    Packs/day: 0.25    Years: 49.00    Additional pack years: 0.00    Total pack  years: 12.25    Types: Cigarettes   Smokeless tobacco: Never   Tobacco comments:    6-7 cigarettes daily- khj 07/25/2022  Substance Use Topics   Alcohol use: Not Currently   Allergies  Allergen Reactions   Aurothioglucose Other (See Comments)    Granulocytopenia   Codeine Nausea Only and Nausea And Vomiting   Methotrexate Derivatives     Liver enlargement   Sulfa Antibiotics Nausea Only   Current Meds  Medication Sig   albuterol (VENTOLIN HFA) 108 (90 Base) MCG/ACT inhaler Inhale 2 puffs into the lungs every 6 (six) hours as needed for wheezing or shortness of breath.   Artificial Tear Solution (SOOTHE HYDRATION) 1.25 % SOLN Apply 1 drop to eye as needed.   azithromycin (ZITHROMAX) 250 MG tablet Take 2 tablets (500 mg) on  Day 1,  followed by 1 tablet (250 mg) once daily on Days 2 through 5.   [START ON 08/28/2022] Fluticasone-Umeclidin-Vilant (TRELEGY ELLIPTA) 100-62.5-25 MCG/ACT AEPB Inhale 1 puff into the lungs daily.   Fluticasone-Umeclidin-Vilant (TRELEGY ELLIPTA) 100-62.5-25 MCG/ACT AEPB Inhale 1 puff into the lungs daily.   levothyroxine (SYNTHROID) 88 MCG tablet Take 88 mcg by mouth daily before breakfast.   methylPREDNISolone (MEDROL DOSEPAK) 4 MG TBPK tablet Take as directed in the package.   oxyCODONE (OXY IR/ROXICODONE) 5 MG immediate release tablet Take 1 tablet (5 mg total) by mouth every 6 (six) hours as needed for severe pain.   vitamin B-12 (CYANOCOBALAMIN) 1000 MCG tablet Take 1,000 mcg by mouth daily.   Immunization History  Administered Date(s) Administered   Influenza Inj Mdck Quad Pf 12/27/2018, 12/15/2020, 12/30/2021   Influenza,inj,Quad PF,6+ Mos 01/01/2020   PNEUMOCOCCAL CONJUGATE-20 06/17/2021   Pneumococcal Polysaccharide-23 03/25/2012   Tdap 05/26/2022        Objective:   Physical Exam BP 110/78 (BP Location: Left Arm, Cuff Size: Normal)   Pulse 96   Temp 97.7 F (36.5 C)   Ht 5\' 6"  (1.676 m)   Wt 135 lb (61.2 kg)   SpO2 99%   BMI 21.79 kg/m    SpO2: 99 % O2 Device: None (Room air)  GENERAL: Well-nourished, well-developed woman, no acute distress.  Fully ambulatory.  Mild conversational dyspnea.  Very well-groomed. HEAD: Normocephalic, atraumatic.  EYES: Pupils equal, round, reactive to light.  No scleral icterus.  MOUTH: Dentition intact, oral mucosa moist.  No thrush. NECK: Supple. No thyromegaly. Trachea midline. No JVD.  No adenopathy. PULMONARY: Poor air movement bilaterally.  Diffuse wheezing throughout.I:E ratio 1:3 CARDIOVASCULAR: S1 and S2. Regular rate and rhythm.  No rubs, murmurs or gallops heard. ABDOMEN: Benign. MUSCULOSKELETAL: No joint deformity, no clubbing, no edema.  NEUROLOGIC: No overt focal deficit, no gait disturbance, speech is fluent. SKIN: Intact,warm,dry.  There is an area of ulceration over the cheek showing some associated edema/induration, this is covered with a bandage. PSYCH: Mood and behavior normal.  Patient received DuoNeb via nebulizer x 1: Improvement in air movement and resolution of wheezing noted.  Drew blood for Circulogene assay.    Assessment & Plan:     ICD-10-CM   1.  COPD with acute exacerbation (HCC)  J44.1 ipratropium-albuterol (DUONEB) 0.5-2.5 (3) MG/3ML nebulizer solution 3 mL   Medrol taper pack Azithromycin x 5 days Trelegy Ellipta 100, 1 inhalation daily Albuterol as needed    2. 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    Dispense:  21 tablet    Refill:  0    azithromycin (ZITHROMAX) 250 MG tablet    Sig: Take 2 tablets (500 mg) on  Day 1,  followed by 1 tablet (250 mg) once daily on Days 2 through 5.    Dispense:  6 each    Refill:  0   Fluticasone-Umeclidin-Vilant (TRELEGY ELLIPTA) 100-62.5-25 MCG/ACT AEPB    Sig: Inhale 1 puff into the lungs daily.    Dispense:  28 each    Refill:  11   albuterol (VENTOLIN HFA) 108 (90 Base) MCG/ACT inhaler    Sig: Inhale 2 puffs into the lungs every 6 (six) hours as needed for wheezing or shortness of breath.    Dispense:  8 g    Refill:  2   ipratropium-albuterol (DUONEB) 0.5-2.5 (3) MG/3ML nebulizer solution 3 mL   Fluticasone-Umeclidin-Vilant (TRELEGY ELLIPTA) 100-62.5-25 MCG/ACT AEPB    Sig: Inhale 1 puff into the lungs daily.    Dispense:  14 each    Refill:  0   We will see the patient in follow-up in 3 to 4 weeks time she is to call sooner should any new problems arise.  Total visit time 45 minutes.  Gailen Shelter, MD Advanced Bronchoscopy PCCM  [facility]    *This note was dictated using voice recognition software/Dragon.  Despite best efforts to proofread, errors can occur which can change the meaning. Any transcriptional errors that result from this process are unintentional and may not be fully corrected at the time of dictation.

## 2022-08-18 ENCOUNTER — Encounter: Payer: Self-pay | Admitting: *Deleted

## 2022-08-18 DIAGNOSIS — D381 Neoplasm of uncertain behavior of trachea, bronchus and lung: Secondary | ICD-10-CM

## 2022-08-18 DIAGNOSIS — R918 Other nonspecific abnormal finding of lung field: Secondary | ICD-10-CM

## 2022-08-18 NOTE — Progress Notes (Signed)
[name], MD sent to Kylie Washington Approved for CT left lung/paraspinal mass bx (pending PET CT findings; may elect for supraclavicular LN biopsy if present). Discussed with Smith Robert and Jayme Cloud.  Kylie Washington

## 2022-08-18 NOTE — Progress Notes (Addendum)
Pt is having worsening pain and shortness of breath. Per Dr. Smith Robert, would like to repeat PET scan to follow up on pain and alternate sites for biopsy if needed. Orders placed and will schedule ASAP once approved with insurance. Pt will follow up with Dr. Smith Robert, Dr. Rushie Chestnut, and Sharia Reeve to further discuss results and pain management.   Additionally, pt will need to be tentatively scheduled for CT guided biopsy of the paraspinal mass (approved by Dr. Juliette Alcide) after gluteal mass excision in case gluteal mass excision is not diagnostic of malignancy.

## 2022-08-18 NOTE — Addendum Note (Signed)
Addended by: Glory Buff on: 08/18/2022 10:16 AM   Modules accepted: Orders

## 2022-08-18 NOTE — Progress Notes (Signed)
Spoke with pt's daughters, Raynelle Fanning and [place], and they are aware of all scheduled appts for PET scan on 5/13, appt with Josh and Dr. Rushie Chestnut on 5/15, as well as tentative lung biopsy on 5/22. Instructed to call with any questions or needs. Nothing further needed at this time.

## 2022-08-21 ENCOUNTER — Ambulatory Visit
Admission: RE | Admit: 2022-08-21 | Discharge: 2022-08-21 | Disposition: A | Discharge status: Home / Self Care | Payer: Medicare PPO | Source: Ambulatory Visit | Attending: Oncology | Admitting: Oncology

## 2022-08-21 ENCOUNTER — Inpatient Hospital Stay: Payer: Medicare PPO | Admitting: Hospice and Palliative Medicine

## 2022-08-21 ENCOUNTER — Ambulatory Visit: Payer: Medicare PPO | Admitting: Oncology

## 2022-08-21 DIAGNOSIS — I700 Atherosclerosis of aorta: Secondary | ICD-10-CM | POA: Insufficient documentation

## 2022-08-21 DIAGNOSIS — R918 Other nonspecific abnormal finding of lung field: Secondary | ICD-10-CM | POA: Diagnosis present

## 2022-08-21 DIAGNOSIS — R911 Solitary pulmonary nodule: Secondary | ICD-10-CM | POA: Insufficient documentation

## 2022-08-21 DIAGNOSIS — L989 Disorder of the skin and subcutaneous tissue, unspecified: Secondary | ICD-10-CM | POA: Insufficient documentation

## 2022-08-21 DIAGNOSIS — R590 Localized enlarged lymph nodes: Secondary | ICD-10-CM | POA: Diagnosis not present

## 2022-08-21 DIAGNOSIS — D4989 Neoplasm of unspecified behavior of other specified sites: Secondary | ICD-10-CM | POA: Insufficient documentation

## 2022-08-21 LAB — GLUCOSE, CAPILLARY: Glucose-Capillary: 114 mg/dL — ABNORMAL HIGH (ref 70–99)

## 2022-08-21 MED ORDER — FLUDEOXYGLUCOSE F - 18 (FDG) INJECTION
7.6600 | Freq: Once | INTRAVENOUS | Status: AC | PRN
  Administered 2022-08-21: 7.66 via INTRAVENOUS

## 2022-08-22 ENCOUNTER — Encounter: Payer: Self-pay | Admitting: Surgery

## 2022-08-22 ENCOUNTER — Ambulatory Visit: Payer: Medicare PPO | Admitting: Urgent Care

## 2022-08-22 ENCOUNTER — Telehealth: Payer: Medicare PPO | Admitting: Hospice and Palliative Medicine

## 2022-08-22 ENCOUNTER — Encounter
Admission: RE | Disposition: A | Discharge status: Home / Self Care | Payer: Self-pay | Source: Home / Self Care | Attending: Surgery

## 2022-08-22 ENCOUNTER — Encounter: Payer: Self-pay | Admitting: Pulmonary Disease

## 2022-08-22 ENCOUNTER — Ambulatory Visit: Payer: Medicare PPO | Admitting: Anesthesiology

## 2022-08-22 ENCOUNTER — Ambulatory Visit
Admission: RE | Admit: 2022-08-22 | Discharge: 2022-08-22 | Disposition: A | Discharge status: Home / Self Care | Payer: Medicare PPO | Attending: Surgery | Admitting: Surgery

## 2022-08-22 ENCOUNTER — Other Ambulatory Visit: Payer: Self-pay

## 2022-08-22 DIAGNOSIS — Z85828 Personal history of other malignant neoplasm of skin: Secondary | ICD-10-CM | POA: Insufficient documentation

## 2022-08-22 DIAGNOSIS — E7800 Pure hypercholesterolemia, unspecified: Secondary | ICD-10-CM | POA: Diagnosis not present

## 2022-08-22 DIAGNOSIS — R222 Localized swelling, mass and lump, trunk: Secondary | ICD-10-CM

## 2022-08-22 DIAGNOSIS — L928 Other granulomatous disorders of the skin and subcutaneous tissue: Secondary | ICD-10-CM | POA: Diagnosis not present

## 2022-08-22 DIAGNOSIS — F17210 Nicotine dependence, cigarettes, uncomplicated: Secondary | ICD-10-CM | POA: Insufficient documentation

## 2022-08-22 DIAGNOSIS — N1831 Chronic kidney disease, stage 3a: Secondary | ICD-10-CM | POA: Diagnosis not present

## 2022-08-22 DIAGNOSIS — L4050 Arthropathic psoriasis, unspecified: Secondary | ICD-10-CM | POA: Diagnosis not present

## 2022-08-22 DIAGNOSIS — E039 Hypothyroidism, unspecified: Secondary | ICD-10-CM | POA: Insufficient documentation

## 2022-08-22 DIAGNOSIS — I2510 Atherosclerotic heart disease of native coronary artery without angina pectoris: Secondary | ICD-10-CM | POA: Insufficient documentation

## 2022-08-22 HISTORY — PX: MASS EXCISION: SHX2000

## 2022-08-22 LAB — AEROBIC/ANAEROBIC CULTURE W GRAM STAIN (SURGICAL/DEEP WOUND)

## 2022-08-22 SURGERY — EXCISION MASS
Anesthesia: General | Laterality: Left

## 2022-08-22 MED ORDER — ORAL CARE MOUTH RINSE: 15.0000 mL | Freq: Once | OROMUCOSAL | Status: AC

## 2022-08-22 MED ORDER — FAMOTIDINE 20 MG PO TABS
ORAL_TABLET | ORAL | Status: AC
  Filled 2022-08-22: qty 1

## 2022-08-22 MED ORDER — FENTANYL CITRATE (PF) 100 MCG/2ML IJ SOLN
INTRAMUSCULAR | Status: DC | PRN
  Administered 2022-08-22: 50 ug via INTRAVENOUS
  Administered 2022-08-22 (×2): 25 ug via INTRAVENOUS

## 2022-08-22 MED ORDER — HYDROCODONE-ACETAMINOPHEN 5-325 MG PO TABS
1.0000 | ORAL_TABLET | Freq: Four times a day (QID) | ORAL | 0 refills | Status: DC | PRN
Start: 2022-08-22 — End: 2022-08-24

## 2022-08-22 MED ORDER — CHLORHEXIDINE GLUCONATE 0.12 % MT SOLN
OROMUCOSAL | Status: AC
  Filled 2022-08-22: qty 15

## 2022-08-22 MED ORDER — BUPIVACAINE HCL (PF) 0.25 % IJ SOLN
INTRAMUSCULAR | Status: AC
  Filled 2022-08-22: qty 30

## 2022-08-22 MED ORDER — OXYCODONE HCL 5 MG PO TABS
5.0000 mg | ORAL_TABLET | Freq: Once | ORAL | Status: AC | PRN
  Administered 2022-08-22: 5 mg via ORAL

## 2022-08-22 MED ORDER — OXYCODONE HCL 5 MG PO TABS
ORAL_TABLET | ORAL | Status: AC
  Filled 2022-08-22: qty 1

## 2022-08-22 MED ORDER — SUGAMMADEX SODIUM 200 MG/2ML IV SOLN
INTRAVENOUS | Status: DC | PRN
  Administered 2022-08-22: 200 mg via INTRAVENOUS

## 2022-08-22 MED ORDER — DEXAMETHASONE SODIUM PHOSPHATE 10 MG/ML IJ SOLN
INTRAMUSCULAR | Status: DC | PRN
  Administered 2022-08-22: 5 mg via INTRAVENOUS

## 2022-08-22 MED ORDER — CEFAZOLIN SODIUM-DEXTROSE 2-4 GM/100ML-% IV SOLN
2.0000 g | INTRAVENOUS | Status: AC
  Administered 2022-08-22: 2 g via INTRAVENOUS

## 2022-08-22 MED ORDER — ONDANSETRON HCL 4 MG/2ML IJ SOLN
INTRAMUSCULAR | Status: AC
  Filled 2022-08-22: qty 2

## 2022-08-22 MED ORDER — CELECOXIB 200 MG PO CAPS
ORAL_CAPSULE | ORAL | Status: AC
  Filled 2022-08-22: qty 1

## 2022-08-22 MED ORDER — ROCURONIUM BROMIDE 10 MG/ML (PF) SYRINGE
PREFILLED_SYRINGE | INTRAVENOUS | Status: AC
  Filled 2022-08-22: qty 10

## 2022-08-22 MED ORDER — EPHEDRINE SULFATE (PRESSORS) 50 MG/ML IJ SOLN
INTRAMUSCULAR | Status: DC | PRN
  Administered 2022-08-22: 15 mg via INTRAVENOUS
  Administered 2022-08-22: 10 mg via INTRAVENOUS

## 2022-08-22 MED ORDER — FAMOTIDINE 20 MG PO TABS
20.0000 mg | ORAL_TABLET | Freq: Once | ORAL | Status: AC
  Administered 2022-08-22: 20 mg via ORAL

## 2022-08-22 MED ORDER — 0.9 % SODIUM CHLORIDE (POUR BTL) OPTIME
TOPICAL | Status: DC | PRN
  Administered 2022-08-22: 1000 mL

## 2022-08-22 MED ORDER — PROMETHAZINE HCL 25 MG/ML IJ SOLN: 6.2500 mg | INTRAMUSCULAR | Status: DC | PRN

## 2022-08-22 MED ORDER — LACTATED RINGERS IV SOLN: INTRAVENOUS | Status: DC

## 2022-08-22 MED ORDER — LIDOCAINE HCL (CARDIAC) PF 100 MG/5ML IV SOSY
PREFILLED_SYRINGE | INTRAVENOUS | Status: DC | PRN
  Administered 2022-08-22: 50 mg via INTRAVENOUS

## 2022-08-22 MED ORDER — CEFAZOLIN SODIUM-DEXTROSE 2-4 GM/100ML-% IV SOLN
INTRAVENOUS | Status: AC
  Filled 2022-08-22: qty 100

## 2022-08-22 MED ORDER — DROPERIDOL 2.5 MG/ML IJ SOLN: 0.6250 mg | Freq: Once | INTRAMUSCULAR | Status: DC | PRN

## 2022-08-22 MED ORDER — CELECOXIB 200 MG PO CAPS
200.0000 mg | ORAL_CAPSULE | ORAL | Status: AC
  Administered 2022-08-22: 200 mg via ORAL

## 2022-08-22 MED ORDER — ONDANSETRON HCL 4 MG/2ML IJ SOLN
INTRAMUSCULAR | Status: DC | PRN
  Administered 2022-08-22: 4 mg via INTRAVENOUS

## 2022-08-22 MED ORDER — LIDOCAINE HCL (PF) 2 % IJ SOLN
INTRAMUSCULAR | Status: AC
  Filled 2022-08-22: qty 5

## 2022-08-22 MED ORDER — BUPIVACAINE LIPOSOME 1.3 % IJ SUSP
INTRAMUSCULAR | Status: AC
  Filled 2022-08-22: qty 20

## 2022-08-22 MED ORDER — BUPIVACAINE-EPINEPHRINE 0.25% -1:200000 IJ SOLN
INTRAMUSCULAR | Status: DC | PRN
  Administered 2022-08-22: 50 mL

## 2022-08-22 MED ORDER — OXYCODONE HCL 5 MG/5ML PO SOLN: 5.0000 mg | Freq: Once | ORAL | Status: AC | PRN

## 2022-08-22 MED ORDER — FENTANYL CITRATE (PF) 100 MCG/2ML IJ SOLN
INTRAMUSCULAR | Status: AC
  Filled 2022-08-22: qty 2

## 2022-08-22 MED ORDER — CHLORHEXIDINE GLUCONATE CLOTH 2 % EX PADS: 6.0000 | MEDICATED_PAD | Freq: Once | CUTANEOUS | Status: DC

## 2022-08-22 MED ORDER — PROPOFOL 10 MG/ML IV BOLUS
INTRAVENOUS | Status: AC
  Filled 2022-08-22: qty 20

## 2022-08-22 MED ORDER — EPINEPHRINE PF 1 MG/ML IJ SOLN
INTRAMUSCULAR | Status: AC
  Filled 2022-08-22: qty 1

## 2022-08-22 MED ORDER — ACETAMINOPHEN 500 MG PO TABS
ORAL_TABLET | ORAL | Status: AC
  Filled 2022-08-22: qty 2

## 2022-08-22 MED ORDER — ACETAMINOPHEN 500 MG PO TABS
1000.0000 mg | ORAL_TABLET | ORAL | Status: AC
  Administered 2022-08-22: 1000 mg via ORAL

## 2022-08-22 MED ORDER — PROPOFOL 10 MG/ML IV BOLUS
INTRAVENOUS | Status: DC | PRN
  Administered 2022-08-22: 100 mg via INTRAVENOUS
  Administered 2022-08-22: 20 mg via INTRAVENOUS
  Administered 2022-08-22: 30 mg via INTRAVENOUS

## 2022-08-22 MED ORDER — GABAPENTIN 300 MG PO CAPS
300.0000 mg | ORAL_CAPSULE | ORAL | Status: AC
  Administered 2022-08-22: 300 mg via ORAL

## 2022-08-22 MED ORDER — GABAPENTIN 300 MG PO CAPS
ORAL_CAPSULE | ORAL | Status: AC
  Filled 2022-08-22: qty 1

## 2022-08-22 MED ORDER — FENTANYL CITRATE (PF) 100 MCG/2ML IJ SOLN
25.0000 ug | INTRAMUSCULAR | Status: DC | PRN
  Administered 2022-08-22 (×4): 25 ug via INTRAVENOUS

## 2022-08-22 MED ORDER — EPHEDRINE 5 MG/ML INJ
INTRAVENOUS | Status: AC
  Filled 2022-08-22: qty 5

## 2022-08-22 MED ORDER — DEXAMETHASONE SODIUM PHOSPHATE 10 MG/ML IJ SOLN
INTRAMUSCULAR | Status: AC
  Filled 2022-08-22: qty 1

## 2022-08-22 MED ORDER — ROCURONIUM BROMIDE 100 MG/10ML IV SOLN
INTRAVENOUS | Status: DC | PRN
  Administered 2022-08-22: 40 mg via INTRAVENOUS

## 2022-08-22 MED ORDER — CHLORHEXIDINE GLUCONATE 0.12 % MT SOLN
15.0000 mL | Freq: Once | OROMUCOSAL | Status: AC
  Administered 2022-08-22: 15 mL via OROMUCOSAL

## 2022-08-22 MED ORDER — SEVOFLURANE IN SOLN
RESPIRATORY_TRACT | Status: AC
  Filled 2022-08-22: qty 250

## 2022-08-22 MED ORDER — ACETAMINOPHEN 10 MG/ML IV SOLN: 1000.0000 mg | Freq: Once | INTRAVENOUS | Status: DC | PRN

## 2022-08-22 MED ORDER — PHENYLEPHRINE 80 MCG/ML (10ML) SYRINGE FOR IV PUSH (FOR BLOOD PRESSURE SUPPORT)
PREFILLED_SYRINGE | INTRAVENOUS | Status: AC
  Filled 2022-08-22: qty 10

## 2022-08-22 SURGICAL SUPPLY — 34 items
ADH SKN CLS APL DERMABOND .7 (GAUZE/BANDAGES/DRESSINGS) ×1
BLADE SURG 15 STRL LF DISP TIS (BLADE) ×1 IMPLANT
BLADE SURG 15 STRL SS (BLADE) ×1
DERMABOND ADVANCED .7 DNX12 (GAUZE/BANDAGES/DRESSINGS) ×1 IMPLANT
DRAPE LAPAROTOMY 100X77 ABD (DRAPES) ×1 IMPLANT
ELECT CAUTERY BLADE 6.4 (BLADE) IMPLANT
ELECT REM PT RETURN 9FT ADLT (ELECTROSURGICAL) ×1
ELECTRODE REM PT RTRN 9FT ADLT (ELECTROSURGICAL) ×1 IMPLANT
GAUZE 4X4 16PLY ~~LOC~~+RFID DBL (SPONGE) IMPLANT
GLOVE BIO SURGEON STRL SZ7 (GLOVE) ×1 IMPLANT
GOWN STRL REUS W/ TWL LRG LVL3 (GOWN DISPOSABLE) ×2 IMPLANT
GOWN STRL REUS W/TWL LRG LVL3 (GOWN DISPOSABLE) ×3
KIT TURNOVER KIT A (KITS) ×1 IMPLANT
LABEL OR SOLS (LABEL) ×1 IMPLANT
LIGASURE IMPACT 36 18CM CVD LR (INSTRUMENTS) IMPLANT
MANIFOLD NEPTUNE II (INSTRUMENTS) ×1 IMPLANT
NDL HYPO 22X1.5 SAFETY MO (MISCELLANEOUS) ×1 IMPLANT
NEEDLE HYPO 22X1.5 SAFETY MO (MISCELLANEOUS) ×1 IMPLANT
NS IRRIG 500ML POUR BTL (IV SOLUTION) ×1 IMPLANT
PACK BASIN MINOR ARMC (MISCELLANEOUS) ×1 IMPLANT
SPONGE T-LAP 18X18 ~~LOC~~+RFID (SPONGE) ×1 IMPLANT
SUT MNCRL 4-0 (SUTURE) ×1
SUT MNCRL 4-0 27XMFL (SUTURE) ×1
SUT SILK 2 0 SH (SUTURE) IMPLANT
SUT VIC AB 0 CT1 36 (SUTURE) ×1 IMPLANT
SUT VIC AB 2-0 CT2 27 (SUTURE) ×1 IMPLANT
SUT VIC AB 2-0 SH 27 (SUTURE) ×1
SUT VIC AB 2-0 SH 27XBRD (SUTURE) IMPLANT
SUTURE MNCRL 4-0 27XMF (SUTURE) ×1 IMPLANT
SWAB CULTURE AMIES ANAERIB BLU (MISCELLANEOUS) IMPLANT
SYR 10ML LL (SYRINGE) ×1 IMPLANT
SYR 20ML LL LF (SYRINGE) IMPLANT
TRAP FLUID SMOKE EVACUATOR (MISCELLANEOUS) ×1 IMPLANT
WATER STERILE IRR 500ML POUR (IV SOLUTION) ×1 IMPLANT

## 2022-08-22 NOTE — Op Note (Signed)
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  Radical Excision of left gluteal mass involving muscle and fascia ( left pelvic mass) mass measures 6x 8cms  This is consistent with metastatic soft tissue mass  ANESTHESIA: GETA  EBL: 5cc  FINDINGS: Soft tissue mass left gluteus w chronic draining sinus and involving of the gluteus maximus and fascia on deep portion  Complications: none  After informed consent was obtained the patient was taking to the OR and GETA induced. Pt Was placed prone with all pressure points padded, she was prepped and draped in the usual sterile fashion.    She did have a chronic draining sinus and we obtained cultures.  There was some seropurulent fluid. Lenticular incision was performed incorporating the draining sinus.  Electrocautery was used to dissect through the subcutaneous tissue all the way to the fascia.  Circumferential dissection was performed with electrocautery.  The central deep portion of the mass was clearly involving the gluteus maximus.  We were able to excise the muscle that had the involvement along with the fascia.   The specimen was marked and sent for permanent pathology  Appropriate hemostasis was obtained with cautery. Fascia was closed with a running 2-0 Vicryl and the subcutaneous tissue was closed with multiple interrupted 2-0 Vicryl sutures. The skin was closed in a subcuticular fashion using 4-0 Monocryl.  Dermabond was applied.  No complications. The  patient tolerated procedure well.

## 2022-08-22 NOTE — Anesthesia Preprocedure Evaluation (Signed)
Anesthesia Evaluation  Patient identified by MRN, date of birth, ID band Patient awake    Reviewed: Allergy & Precautions, NPO status , Patient's Chart, lab work & pertinent test results  History of Anesthesia Complications Negative for: history of anesthetic complications  Airway Mallampati: III   Neck ROM: Full    Dental  (+) Missing   Pulmonary COPD, Current Smoker and Patient abstained from smoking. Neoplasm of uncertain behavior of lung  Recently prescribed Medrol taper pack Azithromycin x 5 days  PET scan on 07/06/2022 showed enlarging hypermetabolic left upper lobe/paraspinal mass consistent with tumor progression    + decreased breath sounds (left worse than right)+ wheezing      Cardiovascular + CAD and + Peripheral Vascular Disease (carotid disease)  Normal cardiovascular exam Rhythm:Regular Rate:Normal  ECG 08/03/22: normal   Neuro/Psych  PSYCHIATRIC DISORDERS  Depression    negative neurological ROS     GI/Hepatic ,GERD  ,,  Endo/Other  Hypothyroidism    Renal/GU CRFRenal disease (stage III CKD)     Musculoskeletal  (+) Arthritis  (psoriatic),  Skin CA   Abdominal   Peds  Hematology   Anesthesia Other Findings Pt has a mediastinal mass extending into the neural foramina at the levels of T2-T5 with severe chronic pain.  In addition she has multiple mediastinal lymphadenopathy as well as a left large gluteal mass. Pt will have excision of gluteal lesion for diagnostic purposes.   Reproductive/Obstetrics                              Anesthesia Physical Anesthesia Plan  ASA: 3  Anesthesia Plan: General   Post-op Pain Management: Tylenol PO (pre-op)*, Celebrex PO (pre-op)* and Gabapentin PO (pre-op)*   Induction: Intravenous  PONV Risk Score and Plan: 2 and Ondansetron, Dexamethasone and Treatment may vary due to age or medical condition  Airway Management Planned: Oral  ETT  Additional Equipment:   Intra-op Plan:   Post-operative Plan: Extubation in OR  Informed Consent: I have reviewed the patients History and Physical, chart, labs and discussed the procedure including the risks, benefits and alternatives for the proposed anesthesia with the patient or authorized representative who has indicated his/her understanding and acceptance.     Dental advisory given  Plan Discussed with: CRNA  Anesthesia Plan Comments: (Patient consented for risks of anesthesia including but not limited to:  - adverse reactions to medications - damage to eyes, teeth, lips or other oral mucosa - nerve damage due to positioning  - sore throat or hoarseness - damage to heart, brain, nerves, lungs, other parts of body or loss of life  Informed patient about role of CRNA in peri- and intra-operative care.  Patient voiced understanding.)         Anesthesia Quick Evaluation

## 2022-08-22 NOTE — Interval H&P Note (Signed)
History and Physical Interval Note:  08/22/2022 9:45 AM  [name]  has presented today for surgery, with the diagnosis of buttock mass.  The various methods of treatment have been discussed with the patient and family. After consideration of risks, benefits and other options for treatment, the patient has consented to  Procedure(s): EXCISION MASS, buttock, RNFA to assist (Left) as a surgical intervention.  The patient's history has been reviewed, patient examined, no change in status, stable for surgery.  I have reviewed the patient's chart and labs.  Questions were answered to the patient's satisfaction.     Nishanth Mccaughan F Makenzy Krist

## 2022-08-22 NOTE — Anesthesia Procedure Notes (Signed)
Procedure Name: Intubation Date/Time: 08/22/2022 10:25 AM  Performed by: Jeannene Patella, CRNAPre-anesthesia Checklist: Patient identified, Emergency Drugs available, Suction available, Patient being monitored and Timeout performed Patient Re-evaluated:Patient Re-evaluated prior to induction Oxygen Delivery Method: Circle system utilized Preoxygenation: Pre-oxygenation with 100% oxygen Induction Type: IV induction Ventilation: Oral airway inserted - appropriate to patient size and Mask ventilation without difficulty Laryngoscope Size: McGraph and 3 Grade View: Grade II Tube type: Oral Tube size: 7.0 mm Number of attempts: 1 Airway Equipment and Method: Stylet and Video-laryngoscopy Secured at: 20 cm Tube secured with: Tape Dental Injury: Teeth and Oropharynx as per pre-operative assessment

## 2022-08-22 NOTE — Transfer of Care (Signed)
Immediate Anesthesia Transfer of Care Note  Patient: Kylie Washington  Procedure(s) Performed: EXCISION MASS, buttock, RNFA to assist (Left)  Patient Location: PACU  Anesthesia Type:General  Level of Consciousness: awake and patient cooperative  Airway & Oxygen Therapy: Patient Spontanous Breathing and Patient connected to face mask oxygen  Post-op Assessment: Report given to RN and Post -op Vital signs reviewed and stable  Post vital signs: Reviewed and stable  Last Vitals:  Vitals Value Taken Time  BP 126/64 08/22/22 1125  Temp    Pulse 90 08/22/22 1129  Resp 21 08/22/22 1129  SpO2 100 % 08/22/22 1129  Vitals shown include unvalidated device data.  Last Pain:  Vitals:   08/22/22 0837  TempSrc: Oral  PainSc: 6          Complications: No notable events documented.

## 2022-08-22 NOTE — Anesthesia Postprocedure Evaluation (Signed)
Anesthesia Post Note  Patient: Derra Moretz  Procedure(s) Performed: EXCISION MASS, buttock, RNFA to assist (Left)  Patient location during evaluation: PACU Anesthesia Type: General Level of consciousness: awake and alert Pain management: pain level controlled Vital Signs Assessment: post-procedure vital signs reviewed and stable Respiratory status: spontaneous breathing, nonlabored ventilation and respiratory function stable Cardiovascular status: blood pressure returned to baseline and stable Postop Assessment: no apparent nausea or vomiting Anesthetic complications: no   No notable events documented.   Last Vitals:  Vitals:   08/22/22 1228 08/22/22 1313  BP: 119/74 117/66  Pulse: 66 60  Resp: 18 16  Temp: (!) 36.4 C   SpO2: 94% 95%    Last Pain:  Vitals:   08/22/22 1228  TempSrc: Temporal  PainSc: 4                  Foye Deer

## 2022-08-22 NOTE — Discharge Instructions (Addendum)
AMBULATORY SURGERY  DISCHARGE INSTRUCTIONS   The drugs that you were given will stay in your system until tomorrow so for the next 24 hours you should not:  Drive an automobile Make any legal decisions Drink any alcoholic beverage   You may resume regular meals tomorrow.  Today it is better to start with liquids and gradually work up to solid foods.  You may eat anything you prefer, but it is better to start with liquids, then soup and crackers, and gradually work up to solid foods.   Please notify your doctor immediately if you have any unusual bleeding, trouble breathing, redness and pain at the surgery site, drainage, fever, or pain not relieved by medication.    Additional Instructions: PLEASE LEAVE EXPAREL (TEAL) ARMBAND ON FOR 4 DAYS    Please contact your physician with any problems or Same Day Surgery at [phone], Monday through Friday 6 am to 4 pm, or  at [facility] number at [phone].  Information for Discharge Teaching: EXPAREL (bupivacaine liposome injectable suspension)   Your surgeon or anesthesiologist gave you EXPAREL(bupivacaine) to help control your pain after surgery.  EXPAREL is a local anesthetic that provides pain relief by numbing the tissue around the surgical site. EXPAREL is designed to release pain medication over time and can control pain for up to 72 hours. Depending on how you respond to EXPAREL, you may require less pain medication during your recovery.  Possible side effects: Temporary loss of sensation or ability to move in the area where bupivacaine was injected. Nausea, vomiting, constipation Rarely, numbness and tingling in your mouth or lips, lightheadedness, or anxiety may occur. Call your doctor right away if you think you may be experiencing any of these sensations, or if you have other questions regarding possible side effects.  Follow all other discharge instructions given to you by your surgeon or nurse. Eat a  healthy diet and drink plenty of water or other fluids.  If you return to the hospital for any reason within 96 hours following the administration of EXPAREL, it is important for health care providers to know that you have received this anesthetic. A teal colored band has been placed on your arm with the date, time and amount of EXPAREL you have received in order to alert and inform your health care providers. Please leave this armband in place for the full 96 hours following administration, and then you may remove the band.  Mass Removal, Care After The following information offers guidance on how to care for yourself after your procedure. Your health care provider may also give you more specific instructions. If you have problems or questions, contact your health care provider. What can I expect after the procedure? After the procedure, it is common to have: Mild pain. Swelling. Bruising. Follow these instructions at home: Bathing  Do not take baths, swim, or use a hot tub until your health care provider approves. Ask your health care provider if you may take showers. You may only be allowed to take sponge baths. Keep your bandage (dressing) clean and dry until your health care provider says it can be removed. Incision care  Follow instructions from your health care provider about how to take care of your incision. Make sure you: Wash your hands with soap and water for at least 20 seconds before and after you change your dressing. If soap and water are not available, use hand sanitizer. Change your dressing as told by your health care provider. Leave stitches (  sutures), skin glue, or adhesive strips in place. These skin closures may need to stay in place for 2 weeks or longer. If adhesive strip edges start to loosen and curl up, you may trim the loose edges. Do not remove adhesive strips completely unless your health care provider tells you to do that. Check your incision area every day for  signs of infection. Check for: More redness, swelling, or pain. Fluid or blood. Warmth. Pus or a bad smell. Medicines Take over-the-counter and prescription medicines only as told by your health care provider. If you were prescribed an antibiotic medicine, use it as told by your health care provider. Do not stop using the antibiotic even if you start to feel better. General instructions  If you were given a sedative during the procedure, it can affect you for several hours. Do not drive or operate machinery until your health care provider says that it is safe. Do not use any products that contain nicotine or tobacco before the procedure. These products include cigarettes, chewing tobacco, and vaping devices, such as e-cigarettes. These can delay healing after surgery. If you need help quitting, ask your health care provider. Return to your normal activities as told by your health care provider. Ask your health care provider what activities are safe for you. Keep all follow-up visits. This is important. Contact a health care provider if: You have more redness, swelling, or pain around your incision. You have fluid or blood coming from your incision. Your incision feels warm to the touch. You have pus or a bad smell coming from your incision. You have pain that does not get better with medicine. Get help right away if: You have chills or a fever. You have severe pain. Summary After the procedure, it is common to have mild pain, swelling, and bruising. Follow instructions from your health care provider about how to take care of your incision. Contact a health care provider if you have signs of infection such as more redness, swelling, or pain. This information is not intended to replace advice given to you by your health care provider. Make sure you discuss any questions you have with your health care provider. Document Revised: 04/15/2021 Document Reviewed: 04/15/2021 Elsevier Patient  Education  2023 [employer].

## 2022-08-23 ENCOUNTER — Encounter: Payer: Medicare PPO | Admitting: Thoracic Surgery (Cardiothoracic Vascular Surgery)

## 2022-08-23 ENCOUNTER — Ambulatory Visit: Payer: Medicare PPO | Admitting: Radiation Oncology

## 2022-08-23 ENCOUNTER — Encounter: Payer: Self-pay | Admitting: Surgery

## 2022-08-23 ENCOUNTER — Inpatient Hospital Stay: Payer: Medicare PPO | Admitting: Hospice and Palliative Medicine

## 2022-08-23 NOTE — Telephone Encounter (Signed)
I will be able to discuss this later this afternoon if that is okay with them.

## 2022-08-23 NOTE — Telephone Encounter (Signed)
I spoke with the patient's daughters earlier today.  All of their questions were answered to their satisfaction.

## 2022-08-23 NOTE — Telephone Encounter (Signed)
  You can call me-Kylie Washington-[phone] and I will add Raynelle Fanning on the line.  Due to an appointment, I won't be available until 2 today.   Thank you so much!   Dr. Jayme Cloud, please see above message. Thanks

## 2022-08-24 ENCOUNTER — Other Ambulatory Visit: Payer: Self-pay

## 2022-08-24 ENCOUNTER — Inpatient Hospital Stay: Payer: Medicare PPO | Attending: Hospice and Palliative Medicine | Admitting: Hospice and Palliative Medicine

## 2022-08-24 ENCOUNTER — Ambulatory Visit
Admission: RE | Admit: 2022-08-24 | Discharge: 2022-08-24 | Disposition: A | Discharge status: Home / Self Care | Payer: Medicare PPO | Source: Ambulatory Visit | Attending: Radiation Oncology | Admitting: Radiation Oncology

## 2022-08-24 ENCOUNTER — Other Ambulatory Visit: Payer: Self-pay | Admitting: *Deleted

## 2022-08-24 ENCOUNTER — Encounter: Payer: Self-pay | Admitting: *Deleted

## 2022-08-24 ENCOUNTER — Encounter: Payer: Self-pay | Admitting: Radiation Oncology

## 2022-08-24 ENCOUNTER — Encounter: Payer: Self-pay | Admitting: Hospice and Palliative Medicine

## 2022-08-24 VITALS — BP 129/86 | HR 73 | Temp 98.2°F | Ht 65.0 in | Wt 135.0 lb

## 2022-08-24 VITALS — BP 129/86 | HR 73 | Temp 98.2°F

## 2022-08-24 DIAGNOSIS — F32A Depression, unspecified: Secondary | ICD-10-CM | POA: Diagnosis not present

## 2022-08-24 DIAGNOSIS — Z79899 Other long term (current) drug therapy: Secondary | ICD-10-CM | POA: Diagnosis not present

## 2022-08-24 DIAGNOSIS — R918 Other nonspecific abnormal finding of lung field: Secondary | ICD-10-CM | POA: Diagnosis not present

## 2022-08-24 DIAGNOSIS — D381 Neoplasm of uncertain behavior of trachea, bronchus and lung: Secondary | ICD-10-CM

## 2022-08-24 DIAGNOSIS — G893 Neoplasm related pain (acute) (chronic): Secondary | ICD-10-CM | POA: Diagnosis not present

## 2022-08-24 DIAGNOSIS — C3412 Malignant neoplasm of upper lobe, left bronchus or lung: Secondary | ICD-10-CM

## 2022-08-24 MED ORDER — OXYCODONE HCL ER 10 MG PO T12A: 10.0000 mg | EXTENDED_RELEASE_TABLET | Freq: Two times a day (BID) | ORAL | 0 refills | Status: DC

## 2022-08-24 MED ORDER — OXYCODONE HCL 5 MG PO TABS: 5.0000 mg | ORAL_TABLET | ORAL | 0 refills | Status: DC | PRN

## 2022-08-24 MED ORDER — NALOXONE HCL 4 MG/0.1ML NA LIQD: NASAL | 0 refills | Status: DC

## 2022-08-24 MED ORDER — XTAMPZA ER 9 MG PO C12A: 1.0000 | EXTENDED_RELEASE_CAPSULE | Freq: Two times a day (BID) | ORAL | 0 refills | Status: DC

## 2022-08-24 NOTE — Progress Notes (Signed)
Radiation Oncology Follow up Note  Name: Kylie Washington   Date:   08/24/2022 MRN:  [id number] DOB: [birth year]    This 82 y.o. 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  REFERRING PROVIDER: Gracelyn Nurse, MD  HPI: Patient is a an 82 year old female originally presented back in 63 with upper back pain.  CT scan demonstrated a 5.4 x 3.2 x 7.9 cm mass extending into the T2-T3 T3-T4 and T4-T5 neuro foramina.  There was mass effect upon the cord.  We treated her with palliative radiation therapy 40 Gray in 20 fractions..  Her biopsies have been nondiagnostic.  She recently had a mass removed from her buttocks with pathology pending.  Recent PET scan and CT scan show again the same area of previous palliative treatment back in 22 progressive disease again with tumor extension to the left side of thoracic canal with mass effect upon the thecal sac.  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  She also has a lesion in her right ninth rib.  She is seen today for consideration of palliative radiation.  She is currently on tramadol for pain.  She is having no focal neurologic deficits although she does apparently have some decreased strength in her upper extremities secondary to pain.  COMPLICATIONS OF TREATMENT: none  FOLLOW UP COMPLIANCE: keeps appointments   PHYSICAL EXAM:  BP 129/86   Pulse 73   Temp 98.2 F (36.8 C)   Ht 5\' 5"  (1.651 m)   Wt 135 lb (61.2 kg)   BMI 22.47 kg/m  Motor strength in her upper extremities somewhat guarded.  No focal neurologic deficits in the upper or lower extremities are noted.  Deep palpation of her spine does not elicit pain.  Well-developed well-nourished patient in NAD. HEENT reveals PERLA, EOMI, discs not visualized.  Oral cavity is clear. No oral mucosal lesions are  identified. Neck is clear without evidence of cervical or supraclavicular adenopathy. Lungs are clear to A&P. Cardiac examination is essentially unremarkable with regular rate and rhythm without murmur rub or thrill. Abdomen is benign with no organomegaly or masses noted. Motor sensory and DTR levels are equal and symmetric in the upper and lower extremities. Cranial nerves II through XII are grossly intact. Proprioception is intact. No peripheral adenopathy or edema is identified. No motor or sensory levels are noted. Crude visual fields are within normal range.  RADIOLOGY RESULTS: PET CT scan reviewed CT scans also reviewed  PLAN: At this time look like to go ahead with palliative radiation therapy to this recurrent mass.  Will be somewhat dose limited to her cord based on previous radiation fields although I like to try to treat with 40 Gray in 20 fractions.  Would use IMRT treatment planning and delivery to spare previously radiated field spinal cord and normal lung volume as well as her esophagus.  Risks and benefits of treatment including skin reaction fatigue radiation esophagitis treatment related to previous treated fields.  All were discussed in detail with the patient and her daughter.  I have personally set up and ordered CT simulation.  Patient comprehends her recommendations well.  I would like to take this opportunity to thank you for allowing me to participate in the care of your patient.Carmina Miller, MD

## 2022-08-24 NOTE — Progress Notes (Signed)
Palliative Medicine [facility] at [facility] [phone] [phone]   Name: Kylie Washington Date: 08/24/2022 MRN: [id number]  DOB: Dec 15, 1940  Patient Care Team: Gracelyn Nurse, MD as PCP - General (Internal Medicine) Glory Buff, RN as Oncology Nurse Navigator Salena Saner, MD as Consulting Physician (Pulmonary Disease) Creig Hines, MD as Consulting Physician (Oncology)    REASON FOR CONSULTATION: Kylie Washington is a 82 y.o. 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 Patient has had history of mid back pain leading to CT scans showing a subpleural mass with tumor extension into the left T2-T3, T3-T4, and T4-T5 neural foramina with mass effect. Patient was started on steroids with significant shrinkage in her mass.  Patient has undergone multiple biopsy attempts which have been nondiagnostic.  She has had ongoing pain.  SOCIAL HISTORY:     reports that she has been smoking cigarettes. She has a 12.25 pack-year smoking history. She has never used smokeless tobacco. She reports that she does not currently use alcohol. She reports that she does not use drugs.  ADVANCE DIRECTIVES:    CODE STATUS:   PAST MEDICAL HISTORY: Past Medical History:  Diagnosis Date   Anxiety    Bilateral carotid artery disease (HCC)    Cancer (HCC) 04/22/2020   lung    Cataract cortical, senile, bilateral    Coronary artery disease    Dyspnea    Hypercholesteremia    Hypothyroidism    Neoplasm related pain    Psoriasis    Psoriatic arthritis (HCC)    Skin cancer    about 3 weeks she has cream to slough the cancer  off her left cheek   Stage 3a chronic kidney disease (HCC)    Tooth abscess 05/31/2020    PAST SURGICAL HISTORY:  Past Surgical History:  Procedure Laterality Date   CARPAL TUNNEL RELEASE Left 1988   CATARACT EXTRACTION, BILATERAL   1997   COLONOSCOPY  2008   COLONOSCOPY  2018   ENDOBRONCHIAL ULTRASOUND Left 08/07/2022   Procedure: ENDOBRONCHIAL ULTRASOUND;  Surgeon: Salena Saner, MD;  Location: [facility] ORS;  Service: Pulmonary;  Laterality: Left;   LIVER BIOPSY  1986   MASS EXCISION Left 08/22/2022   Procedure: EXCISION MASS, buttock, RNFA to assist;  Surgeon: Leafy Ro, MD;  Location: [facility] OR[facility];  Service: General;  Laterality: Left;   STRABISMUS SURGERY Left 1997   TONSILLECTOMY AND ADENOIDECTOMY  1945   VAGINAL HYSTERECTOMY      HEMATOLOGY/ONCOLOGY HISTORY:  Oncology History   No history exists.    ALLERGIES:  is allergic to aurothioglucose, codeine, methotrexate derivatives, and sulfa antibiotics.  MEDICATIONS:  Current Outpatient Medications  Medication Sig Dispense Refill   albuterol (VENTOLIN HFA) 108 (90 Base) MCG/ACT inhaler Inhale 2 puffs into the lungs every 6 (six) hours as needed for wheezing or shortness of breath. 8 g 2   Artificial Tear Solution (SOOTHE HYDRATION) 1.25 % SOLN Apply 1 drop to eye as needed.     [START ON 08/28/2022] Fluticasone-Umeclidin-Vilant (TRELEGY ELLIPTA) 100-62.5-25 MCG/ACT AEPB Inhale 1 puff into the lungs daily. 28 each 11   Fluticasone-Umeclidin-Vilant (TRELEGY ELLIPTA) 100-62.5-25 MCG/ACT AEPB Inhale 1 puff into the lungs daily. 14 each 0   levothyroxine (SYNTHROID) 88 MCG tablet Take 88 mcg by mouth daily before breakfast.     methylPREDNISolone (MEDROL DOSEPAK) 4 MG TBPK tablet Take as directed in the package. 21  tablet 0   naloxone (NARCAN) nasal spray 4 mg/0.1 mL SPRAY 1 SPRAY INTO ONE NOSTRIL AS DIRECTED FOR OPIOID OVERDOSE (TURN PERSON ON SIDE AFTER DOSE. IF NO RESPONSE IN 2-3 MINUTES OR PERSON RESPONDS BUT RELAPSES, REPEAT USING A NEW SPRAY DEVICE AND SPRAY INTO THE OTHER NOSTRIL. CALL 911 AFTER USE.) * EMERGENCY USE ONLY * 1 each 0   polyethylene glycol powder (GLYCOLAX/MIRALAX) 17 GM/SCOOP powder Take 1 Container by mouth as needed for mild constipation.      senna (SENOKOT) 8.6 MG TABS tablet Take 1 tablet by mouth daily as needed for mild constipation.     escitalopram (LEXAPRO) 10 MG tablet Take 1 tablet (10 mg total) by mouth daily. (Patient not taking: Reported on 08/14/2022) 30 tablet 1   oxyCODONE (OXY IR/ROXICODONE) 5 MG immediate release tablet Take 1 tablet (5 mg total) by mouth every 4 (four) hours as needed for severe pain. 45 tablet 0   oxyCODONE ER (XTAMPZA ER) 9 MG C12A Take 1 capsule by mouth every 12 (twelve) hours. 30 capsule 0   vitamin B-12 (CYANOCOBALAMIN) 1000 MCG tablet Take 1,000 mcg by mouth daily. (Patient not taking: Reported on 08/17/2022)     No current facility-administered medications for this visit.    VITAL SIGNS: BP 129/86   Pulse 73   Temp 98.2 F (36.8 C) (Tympanic)  There were no vitals filed for this visit.  Estimated body mass index is 22.47 kg/m as calculated from the following:   Height as of an earlier encounter on 08/24/22: 5\' 5"  (1.651 m).   Weight as of an earlier encounter on 08/24/22: 135 lb (61.2 kg).  LABS: CBC:    Component Value Date/Time   WBC 10.6 (H) 08/03/2022 1509   HGB 13.2 08/03/2022 1509   HCT 43.0 08/03/2022 1509   PLT 174 08/03/2022 1509   MCV 90.0 08/03/2022 1509   NEUTROABS 9.0 (H) 05/03/2020 1443   LYMPHSABS 1.8 05/03/2020 1443   MONOABS 0.8 05/03/2020 1443   EOSABS 0.5 05/03/2020 1443   BASOSABS 0.1 05/03/2020 1443   Comprehensive Metabolic Panel:    Component Value Date/Time   NA 140 08/03/2022 1509   K 3.2 (L) 08/03/2022 1509   CL 101 08/03/2022 1509   CO2 29 08/03/2022 1509   BUN 14 08/03/2022 1509   CREATININE 1.10 (H) 08/03/2022 1509   GLUCOSE 132 (H) 08/03/2022 1509   CALCIUM 8.7 (L) 08/03/2022 1509   AST 14 (L) 05/03/2020 1443   ALT 11 05/03/2020 1443   ALKPHOS 92 05/03/2020 1443   BILITOT 0.5 05/03/2020 1443   PROT 8.0 05/03/2020 1443   ALBUMIN 3.6 05/03/2020 1443    RADIOGRAPHIC STUDIES: NM PET Image Restag (PS) Skull Base To Thigh  Result Date:  08/21/2022 CLINICAL DATA:  Subsequent treatment strategy for left upper lobe lung mass. EXAM: NUCLEAR MEDICINE PET SKULL BASE TO THIGH TECHNIQUE: 7.66 mCi F-18 FDG was injected intravenously. Full-ring PET imaging was performed from the skull base to thigh after the radiotracer. CT data was obtained and used for attenuation correction and anatomic localization. Fasting blood glucose: 114 mg/dl COMPARISON:  16/01/9603 FINDINGS: Mediastinal blood pool activity: SUV max 2.4 Liver activity: SUV max NA NECK: Tracer avid skin lesion along the left cheek is again noted with SUV max 7.46. On the previous exam the SUV max was equal to 10.42. No tracer avid cervical lymph nodes. Incidental CT findings: None. CHEST: Left upper lobe paraspinal mass is again noted. This measures 6.9 x 4.0 cm and  has an SUV max of 16.94. On the previous exam this measured 4.0 x 2.6 cm within SUV max of 19.85. As noted previously there is tumor extension involving the neural foramina at T2-3, T3-4 and T4-5. Tracer avid tumor is identified along the left side of the thoracic canal with mass effect upon the thecal sac. Persistent tracer avid bilateral mediastinal lymph nodes identified. Index AP window lymph node has an SUV max of 3.60. Previously 5.04. Interval development multiple tracer avid bilateral pulmonary nodules compatible with metastatic disease. -Index nodule within the central right upper lobe measures 7 mm and has an SUV max of 6.26, image 47/4. -Left mid lung nodule adjacent to the hilum measures 7 mm with SUV max of 5.38, image 55/4. Index nodule in the right middle lobe measures 8 mm with SUV max of 6.81, image 56/4. Incidental CT findings: Aortic atherosclerosis and coronary artery calcifications. No pericardial effusion. ABDOMEN/PELVIS: No abnormal hypermetabolic activity within the liver, pancreas, adrenal glands, or spleen. No hypermetabolic lymph nodes in the abdomen or pelvis. Incidental CT findings: Aortic atherosclerotic  calcifications. Cyst is noted along the dome of the liver measuring 1.9 cm. SKELETON: There is a new tracer avid lesion involving the posteromedial aspect of the right ninth rib with SUV max of 3.85. -Soft tissue mass within the subcutaneous soft tissues of the left gluteal region measures 4.8 x 2.6 cm with SUV max of 10.79. On the previous PET-CT this measured 4.6 by 2.9 cm within SUV max of 12.56. -New tracer avid subcutaneous nodule within the right ventral chest wall measures 0.7 cm within SUV max of 4.20. New compared with the previous exam. Incidental CT findings: None. IMPRESSION: 1. Interval progression of disease. 2. Interval increase in size of tracer avid left upper lobe paraspinal mass. There is tumor extension into the left side of the thoracic canal with mass effect upon the thecal sac. 3. Interval development of multiple, bilateral tracer avid pulmonary nodules compatible with metastatic disease. 4. New tracer avid lesion involving the posteromedial aspect of the right ninth rib compatible with bone metastasis. 5. Similar appearance of tracer avid skin lesion along the left cheek. 6. Similar appearance of tracer avid soft tissue mass within the subcutaneous soft tissues of the left gluteal region. 7. New tracer avid subcutaneous nodule within the right ventral chest wall. 8.  Aortic Atherosclerosis (ICD10-I70.0). Electronically Signed   By: Signa Kell M.D.   On: 08/21/2022 16:26   CT SUPER D CHEST WO MONARCH PILOT  Result Date: 08/08/2022 CLINICAL DATA:  Left upper lobe mass EXAM: CT CHEST WITHOUT CONTRAST TECHNIQUE: Multidetector CT imaging of the chest was performed using thin slice collimation for electromagnetic bronchoscopy planning purposes, without intravenous contrast. RADIATION DOSE REDUCTION: This exam was performed according to the departmental dose-optimization program which includes automated exposure control, adjustment of the mA and/or kV according to patient size and/or use of  iterative reconstruction technique. COMPARISON:  PET-CT dated 07/06/2022 FINDINGS: Cardiovascular: Heart is normal in size.  No pericardial effusion. No evidence of thoracic aortic aneurysm. Atherosclerotic calcifications of the aortic arch. Moderate three-vessel coronary atherosclerosis. Mediastinum/Nodes: Small mediastinal nodes, including a 9 mm short axis low right paratracheal node (series 2/image 55). These are hypermetabolic on recent PET and favor nodal metastases. Lungs/Pleura: 5.2 x 3.1 cm cavitary mass in the medial left upper lobe/paraspinal region (series 4/image 37). Otherwise, no suspicious pulmonary nodules. Biapical pleural-parenchymal scarring.  No focal consolidation. No pleural effusion or pneumothorax. Upper Abdomen: Stable bilateral renal adenomas, previously  characterized, non FDG avid on PET. Benign hepatic cysts and vascular calcifications. Musculoskeletal: Left paraspinal mass with foraminal extension at T3-4 and T4-5, better evaluated on prior MRI thoracic spine. Mild superior endplate compression fracture deformity at L1, unchanged. IMPRESSION: 5.2 cm cavitary mass in the medial left upper lobe/paraspinal region, with left foraminal extension as above, unchanged. Small mediastinal nodes, hypermetabolic on recent PET, favoring nodal metastases. Aortic Atherosclerosis (ICD10-I70.0). Electronically Signed   By: Charline Bills M.D.   On: 08/08/2022 03:55   DG Chest Port 1 View  Result Date: 08/07/2022 CLINICAL DATA:  Post bronchoscopy EXAM: PORTABLE CHEST 1 VIEW COMPARISON:  Portable exam 1447 hours compared to CT chest 08/03/2022 FINDINGS: Normal heart size and pulmonary vascularity. Atherosclerotic calcification aorta. Medial LEFT upper lobe mass as noted on CT. Soft tissue calcification projecting over RIGHT apex unchanged. Remaining lungs clear without infiltrate, effusion, or pneumothorax. IMPRESSION: LEFT upper lobe mass. No pneumothorax following bronchoscopy. Electronically  Signed   By: Ulyses Southward M.D.   On: 08/07/2022 15:30   DG C-Arm 1-60 Min-No Report  Result Date: 08/07/2022 Fluoroscopy was utilized by the requesting physician.  No radiographic interpretation.   DG C-Arm 1-60 Min-No Report  Result Date: 08/07/2022 Fluoroscopy was utilized by the requesting physician.  No radiographic interpretation.    PERFORMANCE STATUS (ECOG) : 1 - Symptomatic but completely ambulatory  Review of Systems Unless otherwise noted, a complete review of systems is negative.  Physical Exam General: NAD Cardiovascular: regular rate and rhythm Pulmonary: clear ant fields Abdomen: soft, nontender, + bowel sounds GU: no suprapubic tenderness Extremities: no edema, no joint deformities Skin: no rashes Neurological: Weakness but otherwise nonfocal  IMPRESSION: Patient seen following her evaluation with radiation oncology.  Patient endorses persistent upper chest pain secondary to left upper lobe paraspinal mass with interval disease progression noted on PET scan 08/21/2022.  Patient is planned to start XRT but has concerns that her pain will limit her ability to lie flat for the procedure.  Patient has been taking oxycodone with some improvement in pain.  However, she says that she has not been taking it consistently and is averaging 3-4 doses a day.  Patient does report that she is tolerating pain medications well without any adverse effects other than mild constipation.  Patient reports that oxycodone wears off quickly.  We had a long discussion regarding the differences between short acting and long-acting opioids.  Patient is in agreement with trial of a long-acting opioid.  Patient was recently prescribed Norco but has not taken that.  I would prefer to have patient only on 1 short acting and 1 long-acting opioid.  Patient states that she would prefer to stick with oxycodone.  Discussed constipation management and recommended senna/MiraLAX with targeted bowel movement  daily or every other day.  PLAN: -Continue current scope of treatment -Start Xtampza ER 9 mg every 12 hours -Continue oxycodone IR as needed for breakthrough pain -Naloxone -PDMP reviewed -Daily bowel regimen with senna/MiraLAX -Follow-up telephone visit 2 weeks   Patient expressed understanding and was in agreement with this plan. She also understands that She can call the clinic at any time with any questions, concerns, or complaints.     Time Total: 25 minutes  Visit consisted of counseling and education dealing with the complex and emotionally intense issues of symptom management and palliative care in the setting of serious and potentially life-threatening illness.Greater than 50%  of this time was spent counseling and coordinating care related to the above assessment  and plan.  Signed by: Altha Harm, PhD, NP-C

## 2022-08-24 NOTE — Progress Notes (Signed)
Met with patient and her daughter during follow up visit with Dr. Rushie Chestnut and Sharia Reeve. All questions answered during visit. Informed that pathology results are still pending at this time. Will continue to follow. Nothing further needed at this time. Instructed pt to call with any questions or needs. Pt verbalized understanding.

## 2022-08-24 NOTE — Progress Notes (Signed)
Oxycodone ER prescribed by Josh. Required PA. Medication denied by insurance. NP sent new script for oxyCODONE ER (XTAMPZA ER) 9 MG

## 2022-08-25 ENCOUNTER — Other Ambulatory Visit: Payer: Self-pay | Admitting: *Deleted

## 2022-08-25 ENCOUNTER — Encounter: Payer: Self-pay | Admitting: *Deleted

## 2022-08-25 LAB — CULTURE, FUNGUS WITHOUT SMEAR

## 2022-08-25 LAB — AEROBIC/ANAEROBIC CULTURE W GRAM STAIN (SURGICAL/DEEP WOUND)

## 2022-08-25 MED ORDER — XTAMPZA ER 9 MG PO C12A: 1.0000 | EXTENDED_RELEASE_CAPSULE | Freq: Two times a day (BID) | ORAL | 0 refills | Status: DC

## 2022-08-26 LAB — AEROBIC/ANAEROBIC CULTURE W GRAM STAIN (SURGICAL/DEEP WOUND): Gram Stain: NONE SEEN

## 2022-08-27 LAB — CULTURE, FUNGUS WITHOUT SMEAR

## 2022-08-29 ENCOUNTER — Ambulatory Visit: Payer: Medicare PPO | Admitting: Pulmonary Disease

## 2022-08-30 ENCOUNTER — Ambulatory Visit
Admission: RE | Admit: 2022-08-30 | Discharge: 2022-08-30 | Disposition: A | Discharge status: Home / Self Care | Payer: Medicare PPO | Source: Ambulatory Visit | Attending: Radiation Oncology | Admitting: Radiation Oncology

## 2022-08-30 ENCOUNTER — Ambulatory Visit
Admission: RE | Admit: 2022-08-30 | Discharge: 2022-08-30 | Disposition: A | Discharge status: Home / Self Care | Payer: Medicare PPO | Source: Ambulatory Visit | Attending: Oncology | Admitting: Oncology

## 2022-08-30 DIAGNOSIS — C7951 Secondary malignant neoplasm of bone: Secondary | ICD-10-CM | POA: Diagnosis present

## 2022-08-30 DIAGNOSIS — C801 Malignant (primary) neoplasm, unspecified: Secondary | ICD-10-CM | POA: Diagnosis not present

## 2022-08-31 DIAGNOSIS — C7951 Secondary malignant neoplasm of bone: Secondary | ICD-10-CM | POA: Diagnosis not present

## 2022-09-07 ENCOUNTER — Inpatient Hospital Stay (HOSPITAL_BASED_OUTPATIENT_CLINIC_OR_DEPARTMENT_OTHER): Payer: Medicare PPO | Admitting: Hospice and Palliative Medicine

## 2022-09-07 ENCOUNTER — Encounter: Payer: Self-pay | Admitting: *Deleted

## 2022-09-07 DIAGNOSIS — G893 Neoplasm related pain (acute) (chronic): Secondary | ICD-10-CM

## 2022-09-07 DIAGNOSIS — D381 Neoplasm of uncertain behavior of trachea, bronchus and lung: Secondary | ICD-10-CM | POA: Diagnosis not present

## 2022-09-07 NOTE — Progress Notes (Addendum)
Virtual Visit via Telephone Note  I connected with Kylie Washington on 09/07/22 at  1:50 PM EDT by telephone and verified that I am speaking with the correct person using two identifiers.  Location: Patient: Home Provider: Clinic   I discussed the limitations, risks, security and privacy concerns of performing an evaluation and management service by telephone and the availability of in person appointments. I also discussed with the patient that there may be a patient responsible charge related to this service. The patient expressed understanding and agreed to proceed.   History of Present Illness: Kylie Washington is a 82 y.o. 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  Patient has had history of mid back pain leading to CT scans showing a subpleural mass with tumor extension into the left T2-T3, T3-T4, and T4-T5 neural foramina with mass effect.  Patient was started on steroids with significant shrinkage in her mass.  Core biopsy was attempted but nondiagnostic. PET scan on 07/06/2022 showed enlarging hypermetabolic left upper lobe/paraspinal mass consistent with tumor progression.  There was invasion to the neural foramen at T2-T3, T3-T4, and T4-T5.   Observations/Objective: I called and spoke with patient and her daughters.  Patient [place] currently at the beach with [place].  Patient says she feels about the same.  She denies any acute changes or concerns.  She endorses shortness of breath but says that she does not consistently using her inhalers.  I recommended that she maintain consistent dosing as prescribed by pulmonology.  Patient also endorses pain.  She has found some improvement with oxycodone but effects are short-lived.  Patient says that she did not start her Xtampza ER but intends to do so.  We discussed her pain regimen in detail today.  Patient's pathology is still pending.  She begins XRT  next week.  Assessment and Plan: Hypermetabolic left upper lobe/paraspinal mass -pathology pending.  Patient gets XRT next week  Neoplasm related pain -patient has not yet started Xtampza but plans to do so.  Continue oxycodone IR as needed for breakthrough pain  Weight loss -recommend oral nutritional supplements.  Referral to nutrition.  Depression - Recent passing of her husband. Continue Lexapro 10mg  daily.   Follow Up Instructions: Follow-up telephone visit 2 to 3 weeks   I discussed the assessment and treatment plan with the patient. The patient was provided an opportunity to ask questions and all were answered. The patient agreed with the plan and demonstrated an understanding of the instructions.   The patient was advised to call back or seek an in-person evaluation if the symptoms worsen or if the condition fails to improve as anticipated.  I provided 15 minutes of non-face-to-face time during this encounter.   Malachy Moan, NP

## 2022-09-07 NOTE — Addendum Note (Signed)
Addended by: Malachy Moan on: 09/07/2022 02:42 PM   Modules accepted: Level of Service

## 2022-09-07 NOTE — Telephone Encounter (Signed)
I had called patient's home number and left a VM. I am on the phone with her daughter now.

## 2022-09-08 ENCOUNTER — Other Ambulatory Visit: Payer: Self-pay | Admitting: *Deleted

## 2022-09-08 DIAGNOSIS — C3412 Malignant neoplasm of upper lobe, left bronchus or lung: Secondary | ICD-10-CM

## 2022-09-09 ENCOUNTER — Encounter: Payer: Self-pay | Admitting: *Deleted

## 2022-09-09 DIAGNOSIS — D381 Neoplasm of uncertain behavior of trachea, bronchus and lung: Secondary | ICD-10-CM

## 2022-09-11 ENCOUNTER — Ambulatory Visit (INDEPENDENT_AMBULATORY_CARE_PROVIDER_SITE_OTHER): Payer: Medicare PPO | Admitting: Surgery

## 2022-09-11 ENCOUNTER — Ambulatory Visit
Admission: RE | Admit: 2022-09-11 | Discharge: 2022-09-11 | Disposition: A | Discharge status: Home / Self Care | Payer: Medicare PPO | Source: Ambulatory Visit | Attending: Radiation Oncology | Admitting: Radiation Oncology

## 2022-09-11 ENCOUNTER — Encounter: Payer: Self-pay | Admitting: Surgery

## 2022-09-11 VITALS — BP 92/60 | HR 108 | Temp 98.0°F | Ht 66.0 in | Wt 135.4 lb

## 2022-09-11 DIAGNOSIS — C801 Malignant (primary) neoplasm, unspecified: Secondary | ICD-10-CM | POA: Insufficient documentation

## 2022-09-11 DIAGNOSIS — Z923 Personal history of irradiation: Secondary | ICD-10-CM | POA: Insufficient documentation

## 2022-09-11 DIAGNOSIS — R222 Localized swelling, mass and lump, trunk: Secondary | ICD-10-CM

## 2022-09-11 DIAGNOSIS — Z09 Encounter for follow-up examination after completed treatment for conditions other than malignant neoplasm: Secondary | ICD-10-CM

## 2022-09-11 DIAGNOSIS — C7951 Secondary malignant neoplasm of bone: Secondary | ICD-10-CM | POA: Insufficient documentation

## 2022-09-11 DIAGNOSIS — Z510 Encounter for antineoplastic radiation therapy: Secondary | ICD-10-CM | POA: Insufficient documentation

## 2022-09-11 NOTE — Patient Instructions (Signed)
We will call you when the pathology results come in.    Follow-up with our office as needed.  Please call and ask to speak with a nurse if you develop questions or concerns.

## 2022-09-12 ENCOUNTER — Ambulatory Visit
Admission: RE | Admit: 2022-09-12 | Discharge: 2022-09-12 | Disposition: A | Discharge status: Home / Self Care | Payer: Medicare PPO | Source: Ambulatory Visit | Attending: Radiation Oncology | Admitting: Radiation Oncology

## 2022-09-12 ENCOUNTER — Encounter: Payer: Medicare PPO | Admitting: Thoracic Surgery (Cardiothoracic Vascular Surgery)

## 2022-09-12 ENCOUNTER — Other Ambulatory Visit: Payer: Self-pay

## 2022-09-12 DIAGNOSIS — C7951 Secondary malignant neoplasm of bone: Secondary | ICD-10-CM | POA: Diagnosis present

## 2022-09-12 DIAGNOSIS — Z510 Encounter for antineoplastic radiation therapy: Secondary | ICD-10-CM | POA: Diagnosis not present

## 2022-09-12 DIAGNOSIS — C801 Malignant (primary) neoplasm, unspecified: Secondary | ICD-10-CM | POA: Diagnosis not present

## 2022-09-12 DIAGNOSIS — Z923 Personal history of irradiation: Secondary | ICD-10-CM | POA: Diagnosis not present

## 2022-09-12 LAB — RAD ONC ARIA SESSION SUMMARY
Course Elapsed Days: 0
Plan Fractions Treated to Date: 1
Plan Prescribed Dose Per Fraction: 2 Gy
Plan Total Fractions Prescribed: 20
Plan Total Prescribed Dose: 40 Gy
Reference Point Dosage Given to Date: 2 Gy
Reference Point Session Dosage Given: 2 Gy
Session Number: 1

## 2022-09-13 ENCOUNTER — Telehealth: Payer: Self-pay

## 2022-09-13 ENCOUNTER — Ambulatory Visit
Admission: RE | Admit: 2022-09-13 | Discharge: 2022-09-13 | Disposition: A | Discharge status: Home / Self Care | Payer: Medicare PPO | Source: Ambulatory Visit | Attending: Medical Oncology | Admitting: Medical Oncology

## 2022-09-13 ENCOUNTER — Encounter: Payer: Self-pay | Admitting: Medical Oncology

## 2022-09-13 ENCOUNTER — Inpatient Hospital Stay (HOSPITAL_BASED_OUTPATIENT_CLINIC_OR_DEPARTMENT_OTHER): Payer: Medicare PPO | Admitting: Medical Oncology

## 2022-09-13 ENCOUNTER — Other Ambulatory Visit: Payer: Self-pay

## 2022-09-13 ENCOUNTER — Encounter: Payer: Self-pay | Admitting: *Deleted

## 2022-09-13 ENCOUNTER — Other Ambulatory Visit: Payer: Self-pay | Admitting: *Deleted

## 2022-09-13 ENCOUNTER — Ambulatory Visit
Admission: RE | Admit: 2022-09-13 | Discharge: 2022-09-13 | Disposition: A | Discharge status: Home / Self Care | Payer: Medicare PPO | Source: Ambulatory Visit | Attending: Radiation Oncology | Admitting: Radiation Oncology

## 2022-09-13 ENCOUNTER — Inpatient Hospital Stay: Payer: Medicare PPO

## 2022-09-13 ENCOUNTER — Encounter: Payer: Self-pay | Admitting: Pulmonary Disease

## 2022-09-13 ENCOUNTER — Inpatient Hospital Stay: Payer: Medicare PPO | Attending: Hospice and Palliative Medicine

## 2022-09-13 VITALS — BP 111/77 | HR 99 | Temp 97.1°F | Wt 135.0 lb

## 2022-09-13 DIAGNOSIS — R630 Anorexia: Secondary | ICD-10-CM

## 2022-09-13 DIAGNOSIS — R918 Other nonspecific abnormal finding of lung field: Secondary | ICD-10-CM | POA: Diagnosis not present

## 2022-09-13 DIAGNOSIS — D72829 Elevated white blood cell count, unspecified: Secondary | ICD-10-CM

## 2022-09-13 DIAGNOSIS — R7989 Other specified abnormal findings of blood chemistry: Secondary | ICD-10-CM

## 2022-09-13 DIAGNOSIS — D381 Neoplasm of uncertain behavior of trachea, bronchus and lung: Secondary | ICD-10-CM

## 2022-09-13 DIAGNOSIS — Z510 Encounter for antineoplastic radiation therapy: Secondary | ICD-10-CM | POA: Diagnosis not present

## 2022-09-13 DIAGNOSIS — R059 Cough, unspecified: Secondary | ICD-10-CM | POA: Diagnosis not present

## 2022-09-13 DIAGNOSIS — F32A Depression, unspecified: Secondary | ICD-10-CM

## 2022-09-13 LAB — RAD ONC ARIA SESSION SUMMARY
Course Elapsed Days: 1
Plan Fractions Treated to Date: 2
Plan Prescribed Dose Per Fraction: 2 Gy
Plan Total Fractions Prescribed: 20
Plan Total Prescribed Dose: 40 Gy
Reference Point Dosage Given to Date: 4 Gy
Reference Point Session Dosage Given: 2 Gy
Session Number: 2

## 2022-09-13 LAB — CBC WITH DIFFERENTIAL (CANCER CENTER ONLY)
Abs Immature Granulocytes: 0.17 10*3/uL — ABNORMAL HIGH (ref 0.00–0.07)
Basophils Absolute: 0.1 10*3/uL (ref 0.0–0.1)
Basophils Relative: 0 %
Eosinophils Absolute: 0.1 10*3/uL (ref 0.0–0.5)
Eosinophils Relative: 0 %
HCT: 34.9 % — ABNORMAL LOW (ref 36.0–46.0)
Hemoglobin: 10.8 g/dL — ABNORMAL LOW (ref 12.0–15.0)
Immature Granulocytes: 1 %
Lymphocytes Relative: 5 %
Lymphs Abs: 0.8 10*3/uL (ref 0.7–4.0)
MCH: 26 pg (ref 26.0–34.0)
MCHC: 30.9 g/dL (ref 30.0–36.0)
MCV: 84.1 fL (ref 80.0–100.0)
Monocytes Absolute: 1 10*3/uL (ref 0.1–1.0)
Monocytes Relative: 6 %
Neutro Abs: 14.3 10*3/uL — ABNORMAL HIGH (ref 1.7–7.7)
Neutrophils Relative %: 88 %
Platelet Count: 307 10*3/uL (ref 150–400)
RBC: 4.15 MIL/uL (ref 3.87–5.11)
RDW: 14.5 % (ref 11.5–15.5)
WBC Count: 16.3 10*3/uL — ABNORMAL HIGH (ref 4.0–10.5)
nRBC: 0 % (ref 0.0–0.2)

## 2022-09-13 LAB — URINALYSIS, COMPLETE (UACMP) WITH MICROSCOPIC
Bilirubin Urine: NEGATIVE
Glucose, UA: NEGATIVE mg/dL
Hgb urine dipstick: NEGATIVE
Ketones, ur: NEGATIVE mg/dL
Nitrite: NEGATIVE
Protein, ur: 30 mg/dL — AB
Specific Gravity, Urine: 1.019 (ref 1.005–1.030)
pH: 5 (ref 5.0–8.0)

## 2022-09-13 LAB — CMP (CANCER CENTER ONLY)
ALT: 11 U/L (ref 0–44)
AST: 15 U/L (ref 15–41)
Albumin: 2.4 g/dL — ABNORMAL LOW (ref 3.5–5.0)
Alkaline Phosphatase: 93 U/L (ref 38–126)
Anion gap: 13 (ref 5–15)
BUN: 20 mg/dL (ref 8–23)
CO2: 26 mmol/L (ref 22–32)
Calcium: 8.4 mg/dL — ABNORMAL LOW (ref 8.9–10.3)
Chloride: 99 mmol/L (ref 98–111)
Creatinine: 1.66 mg/dL — ABNORMAL HIGH (ref 0.44–1.00)
GFR, Estimated: 31 mL/min — ABNORMAL LOW (ref >60)
Glucose, Bld: 142 mg/dL — ABNORMAL HIGH (ref 70–99)
Potassium: 4.6 mmol/L (ref 3.5–5.1)
Sodium: 138 mmol/L (ref 135–145)
Total Bilirubin: 0.6 mg/dL (ref 0.3–1.2)
Total Protein: 6.5 g/dL (ref 6.5–8.1)

## 2022-09-13 LAB — SURGICAL PATHOLOGY

## 2022-09-13 NOTE — Progress Notes (Signed)
Nutrition Assessment   Reason for Assessment:   Poor appetite - Add on today   ASSESSMENT:  82 year old female with neoplasm of lung of unclear etiology.  Past medical history of CAD, HLD, hypothyroidism.  Currently receiving radiation   Met with patient and daughter.  Patient reports poor appetite/no desire to eat since Mar 24, 2023following death of husband.  Today ate applesauce and 1/2 mini muffin.  Yesterday drank 1/4 of ensure plus shake, 1/2 bacon, egg, cheese biscuit, 100%of hashbrown.  Reports constipation and no bowel movement for the past 4 days.  Does not like miralax. Has not been taking senokot on regular basis.  Daughter asking about appetite stimulant.   Medications: senokot   Labs: glucose 142, creatinine 1.66, calcium 8.4, albumin 2.4   Anthropometrics:   Height: 66 inches Weight: 135 lb 6.4 oz on 6/3 146 lb 06/30/22 BMI: 21  7% weight loss in the last 2 months, significant   Estimated Energy Needs  Kcals: 1525-1800 Protein: 76-90 g Fluid: 1525-1800 ml   NUTRITION DIAGNOSIS: Unintentional weight loss related to cancer as evidenced by 7% weight loss in the last 2 months and decreased appetite    INTERVENTION:  Discussed ways to add calories and protein to diet.  Handout provided Discussed appetite stimulant with NP, Maralyn Sago Encouraged taking senokot daily to help with constipation  Recommend 350 calorie shake or higher. Sample of boost VHC (530 calories) given and ensure complete.   Contact information provided   MONITORING, EVALUATION, GOAL: weight trends, intake   Next Visit: Wednesday, June 26 after radiation  Ahri Olson B. Freida Busman, RD, LDN Registered Dietitian [phone]

## 2022-09-13 NOTE — Progress Notes (Signed)
Symptom Management Clinic [facility] at [facility] [phone]) [phone] [phone]  Patient Care Team: Gracelyn Nurse, MD as PCP - General (Internal Medicine) Glory Buff, RN as Oncology Nurse Navigator Salena Saner, MD as Consulting Physician (Pulmonary Disease) Creig Hines, MD as Consulting Physician (Oncology)   Name of the patient: Kylie Washington  [id number]  Sep 17, 1940   Oncological History: Neoplasm of lung of unclear etiology   History of Present Illness: Kylie Washington is a 82 y.o. 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  Patient has had history of mid back pain leading to CT scans showing a subpleural mass with tumor extension into the left T2-T3, T3-T4, and T4-T5 neural foramina with mass effect.  Patient was started on steroids with significant shrinkage in her mass.  Core biopsy was attempted but nondiagnostic. PET scan on 07/06/2022 showed enlarging hypermetabolic left upper lobe/paraspinal mass consistent with tumor progression.  There was invasion to the neural foramen at T2-T3, T3-T4, and T4-T5.  Current Treatment: XRT started on 09/11/2022 with goal of 20 treatments.   Date of visit: 09/13/22  Reason for Consult: Anabiya Beauford is a 82 y.o. female who presents today for:  Loss of appetite: Patient presents with her daughter. Her daughter expresses concern for patient. She states that since her husband died in 2022-07-06 she has lost her appetite.For instance she ate some of a mini muffin and one apple sauce all day. She only drinks pepsi. She is now staying in bed often especially over the past 3 days since being home from the beach. Patient denies any SI/HI but admits that losing her husband has been really hard on her. Daughter asks about an appetite stimulant. Previously lexapro was prescribed to help with her mood but she has not  taken this yet out of apprehension regarding SSRI/SNRI medications.   In terms of current symptoms they do report a chronic cough which may have worsened over the past few days. They deny any new SOB, chest pain, fevers and also deny any dysuria, urinary frequency, urgency.   Wt Readings from Last 3 Encounters:  09/13/22 135 lb (61.2 kg)  09/11/22 135 lb 6.4 oz (61.4 kg)  08/24/22 135 lb (61.2 kg)    PAST MEDICAL HISTORY: Past Medical History:  Diagnosis Date   Anxiety    Bilateral carotid artery disease (HCC)    Cancer (HCC) 04/22/2020   lung    Cataract cortical, senile, bilateral    Coronary artery disease    Dyspnea    Hypercholesteremia    Hypothyroidism    Neoplasm related pain    Psoriasis    Psoriatic arthritis (HCC)    Skin cancer    about 3 weeks she has cream to slough the cancer  off her left cheek   Stage 3a chronic kidney disease (HCC)    Tooth abscess 05/31/2020    PAST SURGICAL HISTORY:  Past Surgical History:  Procedure Laterality Date   CARPAL TUNNEL RELEASE Left 1988   CATARACT EXTRACTION, BILATERAL  1997   COLONOSCOPY  2008   COLONOSCOPY  2018   ENDOBRONCHIAL ULTRASOUND Left 08/07/2022   Procedure: ENDOBRONCHIAL ULTRASOUND;  Surgeon: Salena Saner, MD;  Location: [facility] ORS;  Service: Pulmonary;  Laterality: Left;   LIVER BIOPSY  1986   MASS EXCISION Left 08/22/2022   Procedure: EXCISION MASS, buttock, RNFA to assist;  Surgeon: Leafy Ro, MD;  Location: [facility]  ORS;  Service: General;  Laterality: Left;   STRABISMUS SURGERY Left 1997   TONSILLECTOMY AND ADENOIDECTOMY  1945   VAGINAL HYSTERECTOMY      HEMATOLOGY/ONCOLOGY HISTORY:  Oncology History   No history exists.    ALLERGIES:  is allergic to aurothioglucose, codeine, methotrexate derivatives, and sulfa antibiotics.  MEDICATIONS:  Current Outpatient Medications  Medication Sig Dispense Refill   albuterol (VENTOLIN HFA) 108 (90 Base) MCG/ACT inhaler Inhale 2 puffs into the lungs  every 6 (six) hours as needed for wheezing or shortness of breath. 8 g 2   Artificial Tear Solution (SOOTHE HYDRATION) 1.25 % SOLN Apply 1 drop to eye as needed.     Fluticasone-Umeclidin-Vilant (TRELEGY ELLIPTA) 100-62.5-25 MCG/ACT AEPB Inhale 1 puff into the lungs daily. 28 each 11   Fluticasone-Umeclidin-Vilant (TRELEGY ELLIPTA) 100-62.5-25 MCG/ACT AEPB Inhale 1 puff into the lungs daily. 14 each 0   levothyroxine (SYNTHROID) 88 MCG tablet Take 88 mcg by mouth daily before breakfast.     methylPREDNISolone (MEDROL DOSEPAK) 4 MG TBPK tablet Take as directed in the package. 21 tablet 0   naloxone (NARCAN) nasal spray 4 mg/0.1 mL SPRAY 1 SPRAY INTO ONE NOSTRIL AS DIRECTED FOR OPIOID OVERDOSE (TURN PERSON ON SIDE AFTER DOSE. IF NO RESPONSE IN 2-3 MINUTES OR PERSON RESPONDS BUT RELAPSES, REPEAT USING A NEW SPRAY DEVICE AND SPRAY INTO THE OTHER NOSTRIL. CALL 911 AFTER USE.) * EMERGENCY USE ONLY * 1 each 0   oxyCODONE (OXY IR/ROXICODONE) 5 MG immediate release tablet Take 1 tablet (5 mg total) by mouth every 4 (four) hours as needed for severe pain. 45 tablet 0   oxyCODONE ER (XTAMPZA ER) 9 MG C12A Take 1 capsule by mouth every 12 (twelve) hours. 30 capsule 0   polyethylene glycol powder (GLYCOLAX/MIRALAX) 17 GM/SCOOP powder Take 1 Container by mouth as needed for mild constipation.     senna (SENOKOT) 8.6 MG TABS tablet Take 1 tablet by mouth daily as needed for mild constipation.     No current facility-administered medications for this visit.    VITAL SIGNS: BP 111/77 (BP Location: Left Arm, Patient Position: Sitting)   Pulse 99   Temp (!) 97.1 F (36.2 C) (Tympanic)   Wt 135 lb (61.2 kg)   SpO2 100%   BMI 21.79 kg/m  Filed Weights   09/13/22 1421  Weight: 135 lb (61.2 kg)    Estimated body mass index is 21.79 kg/m as calculated from the following:   Height as of 09/11/22: 5\' 6"  (1.676 m).   Weight as of this encounter: 135 lb (61.2 kg).  LABS: CBC:    Component Value Date/Time    WBC 16.3 (H) 09/13/2022 1346   WBC 10.6 (H) 08/03/2022 1509   HGB 10.8 (L) 09/13/2022 1346   HCT 34.9 (L) 09/13/2022 1346   PLT 307 09/13/2022 1346   MCV 84.1 09/13/2022 1346   NEUTROABS 14.3 (H) 09/13/2022 1346   LYMPHSABS 0.8 09/13/2022 1346   MONOABS 1.0 09/13/2022 1346   EOSABS 0.1 09/13/2022 1346   BASOSABS 0.1 09/13/2022 1346   Comprehensive Metabolic Panel:    Component Value Date/Time   NA 138 09/13/2022 1346   K 4.6 09/13/2022 1346   CL 99 09/13/2022 1346   CO2 26 09/13/2022 1346   BUN 20 09/13/2022 1346   CREATININE 1.66 (H) 09/13/2022 1346   GLUCOSE 142 (H) 09/13/2022 1346   CALCIUM 8.4 (L) 09/13/2022 1346   AST 15 09/13/2022 1346   ALT 11 09/13/2022 1346   ALKPHOS  93 09/13/2022 1346   BILITOT 0.6 09/13/2022 1346   PROT 6.5 09/13/2022 1346   ALBUMIN 2.4 (L) 09/13/2022 1346    RADIOGRAPHIC STUDIES: NM PET Image Restag (PS) Skull Base To Thigh  Result Date: 08/21/2022 CLINICAL DATA:  Subsequent treatment strategy for left upper lobe lung mass. EXAM: NUCLEAR MEDICINE PET SKULL BASE TO THIGH TECHNIQUE: 7.66 mCi F-18 FDG was injected intravenously. Full-ring PET imaging was performed from the skull base to thigh after the radiotracer. CT data was obtained and used for attenuation correction and anatomic localization. Fasting blood glucose: 114 mg/dl COMPARISON:  16/01/9603 FINDINGS: Mediastinal blood pool activity: SUV max 2.4 Liver activity: SUV max NA NECK: Tracer avid skin lesion along the left cheek is again noted with SUV max 7.46. On the previous exam the SUV max was equal to 10.42. No tracer avid cervical lymph nodes. Incidental CT findings: None. CHEST: Left upper lobe paraspinal mass is again noted. This measures 6.9 x 4.0 cm and has an SUV max of 16.94. On the previous exam this measured 4.0 x 2.6 cm within SUV max of 19.85. As noted previously there is tumor extension involving the neural foramina at T2-3, T3-4 and T4-5. Tracer avid tumor is identified along the  left side of the thoracic canal with mass effect upon the thecal sac. Persistent tracer avid bilateral mediastinal lymph nodes identified. Index AP window lymph node has an SUV max of 3.60. Previously 5.04. Interval development multiple tracer avid bilateral pulmonary nodules compatible with metastatic disease. -Index nodule within the central right upper lobe measures 7 mm and has an SUV max of 6.26, image 47/4. -Left mid lung nodule adjacent to the hilum measures 7 mm with SUV max of 5.38, image 55/4. Index nodule in the right middle lobe measures 8 mm with SUV max of 6.81, image 56/4. Incidental CT findings: Aortic atherosclerosis and coronary artery calcifications. No pericardial effusion. ABDOMEN/PELVIS: No abnormal hypermetabolic activity within the liver, pancreas, adrenal glands, or spleen. No hypermetabolic lymph nodes in the abdomen or pelvis. Incidental CT findings: Aortic atherosclerotic calcifications. Cyst is noted along the dome of the liver measuring 1.9 cm. SKELETON: There is a new tracer avid lesion involving the posteromedial aspect of the right ninth rib with SUV max of 3.85. -Soft tissue mass within the subcutaneous soft tissues of the left gluteal region measures 4.8 x 2.6 cm with SUV max of 10.79. On the previous PET-CT this measured 4.6 by 2.9 cm within SUV max of 12.56. -New tracer avid subcutaneous nodule within the right ventral chest wall measures 0.7 cm within SUV max of 4.20. New compared with the previous exam. Incidental CT findings: None. IMPRESSION: 1. Interval progression of disease. 2. Interval increase in size of tracer avid left upper lobe paraspinal mass. There is tumor extension into the left side of the thoracic canal with mass effect upon the thecal sac. 3. Interval development of multiple, bilateral tracer avid pulmonary nodules compatible with metastatic disease. 4. New tracer avid lesion involving the posteromedial aspect of the right ninth rib compatible with bone  metastasis. 5. Similar appearance of tracer avid skin lesion along the left cheek. 6. Similar appearance of tracer avid soft tissue mass within the subcutaneous soft tissues of the left gluteal region. 7. New tracer avid subcutaneous nodule within the right ventral chest wall. 8.  Aortic Atherosclerosis (ICD10-I70.0). Electronically Signed   By: Signa Kell M.D.   On: 08/21/2022 16:26    PERFORMANCE STATUS (ECOG) : 3 - Symptomatic, >50% confined  to bed  Review of Systems Unless otherwise noted, a complete review of systems is negative.  Physical Exam General: NAD. Semi flat affect.  Cardiovascular: regular rate and rhythm Pulmonary: Mild wheezing throughout without any rhonchi, crackles  Abdomen: soft, nontender, + bowel sounds GU: no suprapubic tenderness Extremities: no edema, no joint deformities Skin: no rashes Neurological: Weakness but otherwise nonfocal  Assessment and Plan- Patient is a 82 y.o. female    Encounter Diagnoses  Name Primary?   Leukocytosis, unspecified type Yes   Mass of left lung    Depression, unspecified depression type    Loss of appetite    Elevated serum creatinine    Leukocytosis: Acute on chronic. Elevated neutrophils- not on steroids at this time or recent. Question if she has an infection that is causing her symptoms to worsen this week. Will check a chest x ray for pneumonia and UA/culture for UTI. RTC next week for follow up to recheck labs and ensure symptoms have improved.   Mass of Lung: Patient seen by clinical nurse navigator today. She will continue current plan of XRT.  Depression/Loss of appetite: Likely secondary to suspected cancer and death of her husband. She does have a history of hypothyroidism however recent TSH was normal so this is not likely related. I discussed Lexapro vs Mirtazipine with patient and daughter for assistance with her mood and appetite. Reviewed risks/benefits. At this time they would like to start her lexapro and  hold off on mirtazipine or other appetite stimulants. We discussed that it can take 6-8 weeks for full effect but often improvement in symptoms such as appetite occur in about 2 weeks. Social work consult submitted. She also met with Joli in nutrition today.   Elevated Serum Creatinine: Acute on chronic. Likely secondary to decreased oral intake. Also history of heavy soda intake. I discussed my advisement for her to reduce her soda intake and increase water intake. She will also increase general hydration. No IVF desired at this time. Will reconsider at next visit after we assess if oral hydration is effective at lowering her creatinine value.   In summary: Chest x ray, urinalysis/culture for worsening leukocytosis/loss of appetite/fatigue in elderly. ABX if indicated Increasing hydration with water; lowering Pepsi intake to help with elevated creatinine level Following nutritional guidelines by [employer] She will start her Lexapro previously prescribed  Social work referral submitted Follow up early next week with APP, labs ( CBC w/, CMP) to ensure leukocytosis and elevated creatinine have improved.    Patient expressed understanding and was in agreement with this plan. She also understands that She can call clinic at any time with any questions, concerns, or complaints.   Thank you for allowing me to participate in the care of this very pleasant patient.   Time Total: 25  Visit consisted of counseling and education dealing with the complex and emotionally intense issues of symptom management in the setting of serious illness.Greater than 50%  of this time was spent counseling and coordinating care related to the above assessment and plan.  Signed by: Clent Jacks, PA-C

## 2022-09-13 NOTE — Telephone Encounter (Signed)
Notified patient's daughter, Sonny Masters,  as instructed, she was very pleased. Discussed follow-up as needed.

## 2022-09-13 NOTE — Telephone Encounter (Signed)
-----   Message from Leafy Ro, MD sent at 09/13/2022 10:05 AM EDT ----- Please let her know that pathology shows chornic inflammation but no cancer  ----- Message ----- From: Interface, Lab In [place] Sent: 08/22/2022   6:02 PM EDT To: Leafy Ro, MD

## 2022-09-13 NOTE — Progress Notes (Signed)
Per Dr. Smith Robert, gluteal mass excision was negative for malignancy and pt needs biopsy of the lung mass to obtain diagnosis. Options for biopsy reviewed with patient with the following: CT guided biopsy, referral to cardiothoracic surgery, or referral to [facility]. Pt stated that would like to think about her options and let me know.

## 2022-09-13 NOTE — Telephone Encounter (Signed)
error 

## 2022-09-13 NOTE — Progress Notes (Signed)
-  82 year-old female 2 and half weeks after excision of buttock mass.  Pathology is still pending.  She is doing very well without complications  PE NAD wound healing well, no infection  A/P doing very well after excision of gluteal mass.  Awaiting pathology results.  Will call back with definitive pathology.  No evidence of surgical complications

## 2022-09-14 ENCOUNTER — Encounter: Payer: Self-pay | Admitting: *Deleted

## 2022-09-14 ENCOUNTER — Ambulatory Visit
Admission: RE | Admit: 2022-09-14 | Discharge: 2022-09-14 | Disposition: A | Discharge status: Home / Self Care | Payer: Medicare PPO | Source: Ambulatory Visit | Attending: Radiation Oncology | Admitting: Radiation Oncology

## 2022-09-14 ENCOUNTER — Other Ambulatory Visit: Payer: Self-pay

## 2022-09-14 ENCOUNTER — Inpatient Hospital Stay: Payer: Medicare PPO | Admitting: Hospice and Palliative Medicine

## 2022-09-14 ENCOUNTER — Telehealth: Payer: Self-pay | Admitting: Medical Oncology

## 2022-09-14 DIAGNOSIS — Z510 Encounter for antineoplastic radiation therapy: Secondary | ICD-10-CM | POA: Diagnosis not present

## 2022-09-14 LAB — RAD ONC ARIA SESSION SUMMARY
Course Elapsed Days: 2
Plan Fractions Treated to Date: 3
Plan Prescribed Dose Per Fraction: 2 Gy
Plan Total Fractions Prescribed: 20
Plan Total Prescribed Dose: 40 Gy
Reference Point Dosage Given to Date: 6 Gy
Reference Point Session Dosage Given: 2 Gy
Session Number: 3

## 2022-09-14 LAB — URINE CULTURE: Culture: <10000 — AB

## 2022-09-14 MED ORDER — AMOXICILLIN-POT CLAVULANATE 875-125 MG PO TABS: 1.0000 | ORAL_TABLET | Freq: Two times a day (BID) | ORAL | 0 refills | Status: AC

## 2022-09-14 MED ORDER — DOXYCYCLINE HYCLATE 100 MG PO TABS: 100.0000 mg | ORAL_TABLET | Freq: Two times a day (BID) | ORAL | 0 refills | Status: AC

## 2022-09-14 NOTE — Addendum Note (Signed)
Addended by: Clent Jacks on: 09/14/2022 02:56 PM   Modules accepted: Orders

## 2022-09-14 NOTE — Telephone Encounter (Signed)
Spoke with both daughters and patient regarding her chest x-ray results.  Given her results, current XRT and clinical signs and symptoms I suspect she does have pneumonia.  Less concerned about metastatic disease given current XRT and clinical picture and symptoms.  I am treating her with Augmentin/doxycycline.  I discussed the use of these medications including common potential side effects of precautions with them.  Also reaching out to radiation oncology team to see if she needs to hold XRT at this time.  Reviewed red flags in terms of pneumonia that would indicate she needs to go to the hospital.  Follow-up next week has already been scheduled.

## 2022-09-14 NOTE — Telephone Encounter (Signed)
Urgent referral to [facility] faxed to [phone]. Their clinic will call pt with appt once scheduled.

## 2022-09-15 ENCOUNTER — Ambulatory Visit: Payer: Medicare PPO

## 2022-09-15 ENCOUNTER — Inpatient Hospital Stay: Payer: Medicare PPO | Admitting: Medical Oncology

## 2022-09-15 ENCOUNTER — Inpatient Hospital Stay: Payer: Medicare PPO

## 2022-09-15 ENCOUNTER — Other Ambulatory Visit: Payer: Self-pay

## 2022-09-15 ENCOUNTER — Inpatient Hospital Stay (HOSPITAL_BASED_OUTPATIENT_CLINIC_OR_DEPARTMENT_OTHER): Payer: Medicare PPO | Admitting: Hospice and Palliative Medicine

## 2022-09-15 ENCOUNTER — Telehealth: Payer: Self-pay | Admitting: *Deleted

## 2022-09-15 VITALS — BP 106/82 | HR 93 | Temp 97.8°F | Resp 18

## 2022-09-15 DIAGNOSIS — D381 Neoplasm of uncertain behavior of trachea, bronchus and lung: Secondary | ICD-10-CM

## 2022-09-15 DIAGNOSIS — R630 Anorexia: Secondary | ICD-10-CM

## 2022-09-15 DIAGNOSIS — J189 Pneumonia, unspecified organism: Secondary | ICD-10-CM

## 2022-09-15 DIAGNOSIS — F32A Depression, unspecified: Secondary | ICD-10-CM | POA: Diagnosis not present

## 2022-09-15 DIAGNOSIS — E878 Other disorders of electrolyte and fluid balance, not elsewhere classified: Secondary | ICD-10-CM | POA: Insufficient documentation

## 2022-09-15 DIAGNOSIS — D72829 Elevated white blood cell count, unspecified: Secondary | ICD-10-CM | POA: Diagnosis present

## 2022-09-15 DIAGNOSIS — R7989 Other specified abnormal findings of blood chemistry: Secondary | ICD-10-CM | POA: Diagnosis not present

## 2022-09-15 LAB — CMP (CANCER CENTER ONLY)
ALT: 11 U/L (ref 0–44)
AST: 16 U/L (ref 15–41)
Albumin: 2.4 g/dL — ABNORMAL LOW (ref 3.5–5.0)
Alkaline Phosphatase: 92 U/L (ref 38–126)
Anion gap: 11 (ref 5–15)
BUN: 26 mg/dL — ABNORMAL HIGH (ref 8–23)
CO2: 27 mmol/L (ref 22–32)
Calcium: 8.2 mg/dL — ABNORMAL LOW (ref 8.9–10.3)
Chloride: 100 mmol/L (ref 98–111)
Creatinine: 1.88 mg/dL — ABNORMAL HIGH (ref 0.44–1.00)
GFR, Estimated: 27 mL/min — ABNORMAL LOW (ref >60)
Glucose, Bld: 118 mg/dL — ABNORMAL HIGH (ref 70–99)
Potassium: 4.2 mmol/L (ref 3.5–5.1)
Sodium: 138 mmol/L (ref 135–145)
Total Bilirubin: 0.7 mg/dL (ref 0.3–1.2)
Total Protein: 6.4 g/dL — ABNORMAL LOW (ref 6.5–8.1)

## 2022-09-15 LAB — CBC WITH DIFFERENTIAL (CANCER CENTER ONLY)
Abs Immature Granulocytes: 0.16 10*3/uL — ABNORMAL HIGH (ref 0.00–0.07)
Basophils Absolute: 0 10*3/uL (ref 0.0–0.1)
Basophils Relative: 0 %
Eosinophils Absolute: 0 10*3/uL (ref 0.0–0.5)
Eosinophils Relative: 0 %
HCT: 34.5 % — ABNORMAL LOW (ref 36.0–46.0)
Hemoglobin: 10.7 g/dL — ABNORMAL LOW (ref 12.0–15.0)
Immature Granulocytes: 1 %
Lymphocytes Relative: 5 %
Lymphs Abs: 0.7 10*3/uL (ref 0.7–4.0)
MCH: 25.9 pg — ABNORMAL LOW (ref 26.0–34.0)
MCHC: 31 g/dL (ref 30.0–36.0)
MCV: 83.5 fL (ref 80.0–100.0)
Monocytes Absolute: 0.8 10*3/uL (ref 0.1–1.0)
Monocytes Relative: 5 %
Neutro Abs: 14.7 10*3/uL — ABNORMAL HIGH (ref 1.7–7.7)
Neutrophils Relative %: 89 %
Platelet Count: 309 10*3/uL (ref 150–400)
RBC: 4.13 MIL/uL (ref 3.87–5.11)
RDW: 14.6 % (ref 11.5–15.5)
WBC Count: 16.4 10*3/uL — ABNORMAL HIGH (ref 4.0–10.5)
nRBC: 0 % (ref 0.0–0.2)

## 2022-09-15 NOTE — Progress Notes (Signed)
I spoke with patient's daughter by phone.  Reportedly, patient has minimal oral intake and has been unable to consistently take her prescribed oral antibiotics.  She is mostly in the bed and sleeping.  We discussed ER utilization but daughter would be interested in having patient evaluated in clinic first.  Will see if we can schedule [facility] visit this afternoon.  Discussed with Clent Jacks PA-C

## 2022-09-15 NOTE — Telephone Encounter (Signed)
I was asked by Sharia Reeve to call daughter. Pt needs to be added to Sarah's schedule today. Pt very weak, persistently short of breath and declining in performance status. pt may require direct admit/ER. Pt will be here by 1245 for labs- per v/o sarah cbc/metc

## 2022-09-15 NOTE — Progress Notes (Addendum)
Took 1 dose of doxycyline last evening and 1 dose of Augmentin this morning.  Not eating/drinking sufficiently. Pt only had a cup of applesauce this morning.  Patient stays in bed/couch majority of the day. Has no energy to perform ADLS today.  Pt fell last evening and hit her head. Bruising noted bilaterally on forehead. Pt stated that she tripped over her suitcase that was in floor. Pt reports bilateral lower extremity muscle cramps/tenderness. Pt has chronic cancer associated upper back pain. pt Has not had any xtampza since Tuesday. Pt was very lethargic and pt therefore d/c the xtampza to see if this would improve the lethargy.  C/o nausea. C/o constipation. Last large bm Wednesday. Pt started senna daily yesterday.

## 2022-09-15 NOTE — Progress Notes (Addendum)
Symptom Management Clinic [facility] at [facility] [phone] [phone]  Patient Care Team: Gracelyn Nurse, MD as PCP - General (Internal Medicine) Glory Buff, RN as Oncology Nurse Navigator Salena Saner, MD as Consulting Physician (Pulmonary Disease) Creig Hines, MD as Consulting Physician (Oncology)   Name of the patient: Kylie Washington  [id number]  03/04/1941   Oncological History: Neoplasm of lung of unclear etiology   History of Present Illness: Kymberley Raz is a 82 y.o. female with multiple medical problems including paravertebral lung mass of unclear etiology with previous biopsies (4) being nondiagnostic.  Patient has had history of mid back pain leading to CT scans showing a subpleural mass with tumor extension into the left T2-T3, T3-T4, and T4-T5 neural foramina with mass effect.  Patient was started on steroids with significant shrinkage in her mass.  Core biopsy was attempted but nondiagnostic. PET scan on 07/06/2022 showed enlarging hypermetabolic left upper lobe/paraspinal mass consistent with tumor progression.  There was invasion to the neural foramen at T2-T3, T3-T4, and T4-T5. She was offered additional bronchoscopy and biopsy however she declined. XRT agreeable by patient to help offer palliative relief of symptoms which she started on 09/12/2022. No systemic treatment has been started due to lack of available tissue diagnosis.   Current Treatment: XRT started on 09/12/2022 with goal of 20 treatments.   Date of visit: 09/15/22  Reason for Consult: Latishia Suitt is a 82 y.o. female who presents today for an acute visit:  Cough: Patient presents with her daughters for an acute visit related to her loss of appetite, nausea and cough. She was seen on 09/13/2022 for loss of appetite and was found to have progressively worse leukocytosis.  At this visit they expressed that she was not eating much and they asked to  see if she had anything for an appetite stimulator.  During the course of our conversation we discovered the patient had been having an acute cough and worsening of her symptoms of fatigue/loss of appetite. Chest x ray was ordered and showed likely pneumonia versus progression of disease.  She was started on doxycycline and Augmentin to cover for suspected community-acquired pneumonia.   Patient and daughters return today out of concern that patient is not able to tolerate her oral antibiotics, any foods other than a few bites of her applesauce this morning, and barely any liquid. Despite anti-nausea medications taking her antibiotics is making her queezy. No vomiting. No throat pain. She was able to take a few sips of Pepsi but otherwise has not had any fluid intake in a day despite advisement that due to her elevated creatinine levels that oral hydration is important.  She is having progressive generalized weakness and she had 1 episode 2 days ago where she fell over her suitcase and hit her head.  No reported loss of consciousness. No current headache.   Wt Readings from Last 3 Encounters:  09/13/22 135 lb (61.2 kg)  09/11/22 135 lb 6.4 oz (61.4 kg)  08/24/22 135 lb (61.2 kg)    PAST MEDICAL HISTORY: Past Medical History:  Diagnosis Date   Anxiety    Bilateral carotid artery disease (HCC)    Cancer (HCC) 04/22/2020   lung    Cataract cortical, senile, bilateral    Coronary artery disease    Dyspnea    Hypercholesteremia    Hypothyroidism    Neoplasm related pain    Psoriasis    Psoriatic arthritis (HCC)  Skin cancer    about 3 weeks she has cream to slough the cancer  off her left cheek   Stage 3a chronic kidney disease (HCC)    Tooth abscess 05/31/2020    PAST SURGICAL HISTORY:  Past Surgical History:  Procedure Laterality Date   CARPAL TUNNEL RELEASE Left 1988   CATARACT EXTRACTION, BILATERAL  1997   COLONOSCOPY  2008   COLONOSCOPY  2018   ENDOBRONCHIAL ULTRASOUND Left  08/07/2022   Procedure: ENDOBRONCHIAL ULTRASOUND;  Surgeon: Salena Saner, MD;  Location: [facility] ORS;  Service: Pulmonary;  Laterality: Left;   LIVER BIOPSY  1986   MASS EXCISION Left 08/22/2022   Procedure: EXCISION MASS, buttock, RNFA to assist;  Surgeon: Leafy Ro, MD;  Location: [facility] ORS;  Service: General;  Laterality: Left;   STRABISMUS SURGERY Left 1997   TONSILLECTOMY AND ADENOIDECTOMY  1945   VAGINAL HYSTERECTOMY      HEMATOLOGY/ONCOLOGY HISTORY:  Oncology History   No history exists.    ALLERGIES:  is allergic to aurothioglucose, codeine, methotrexate derivatives, and sulfa antibiotics.  MEDICATIONS:  Current Outpatient Medications  Medication Sig Dispense Refill   albuterol (VENTOLIN HFA) 108 (90 Base) MCG/ACT inhaler Inhale 2 puffs into the lungs every 6 (six) hours as needed for wheezing or shortness of breath. 8 g 2   amoxicillin-clavulanate (AUGMENTIN) 875-125 MG tablet Take 1 tablet by mouth 2 (two) times daily for 7 days. 14 tablet 0   Artificial Tear Solution (SOOTHE HYDRATION) 1.25 % SOLN Apply 1 drop to eye as needed.     doxycycline (VIBRA-TABS) 100 MG tablet Take 1 tablet (100 mg total) by mouth 2 (two) times daily for 7 days. 14 tablet 0   Fluticasone-Umeclidin-Vilant (TRELEGY ELLIPTA) 100-62.5-25 MCG/ACT AEPB Inhale 1 puff into the lungs daily. 28 each 11   Fluticasone-Umeclidin-Vilant (TRELEGY ELLIPTA) 100-62.5-25 MCG/ACT AEPB Inhale 1 puff into the lungs daily. 14 each 0   levothyroxine (SYNTHROID) 88 MCG tablet Take 88 mcg by mouth daily before breakfast.     methylPREDNISolone (MEDROL DOSEPAK) 4 MG TBPK tablet Take as directed in the package. 21 tablet 0   naloxone (NARCAN) nasal spray 4 mg/0.1 mL SPRAY 1 SPRAY INTO ONE NOSTRIL AS DIRECTED FOR OPIOID OVERDOSE (TURN PERSON ON SIDE AFTER DOSE. IF NO RESPONSE IN 2-3 MINUTES OR PERSON RESPONDS BUT RELAPSES, REPEAT USING A NEW SPRAY DEVICE AND SPRAY INTO THE OTHER NOSTRIL. CALL 911 AFTER USE.) * EMERGENCY USE  ONLY * 1 each 0   oxyCODONE (OXY IR/ROXICODONE) 5 MG immediate release tablet Take 1 tablet (5 mg total) by mouth every 4 (four) hours as needed for severe pain. 45 tablet 0   oxyCODONE ER (XTAMPZA ER) 9 MG C12A Take 1 capsule by mouth every 12 (twelve) hours. 30 capsule 0   polyethylene glycol powder (GLYCOLAX/MIRALAX) 17 GM/SCOOP powder Take 1 Container by mouth as needed for mild constipation.     senna (SENOKOT) 8.6 MG TABS tablet Take 1 tablet by mouth daily as needed for mild constipation.     No current facility-administered medications for this visit.    VITAL SIGNS: BP 106/82 Comment: recheck  Pulse 93   Temp 97.8 F (36.6 C) (Tympanic)   Resp 18   SpO2 99%  There were no vitals filed for this visit.   Estimated body mass index is 21.79 kg/m as calculated from the following:   Height as of 09/11/22: 5\' 6"  (1.676 m).   Weight as of 09/13/22: 135 lb (61.2 kg).  LABS:  CBC:    Component Value Date/Time   WBC 16.4 (H) 09/15/2022 1242   WBC 10.6 (H) 08/03/2022 1509   HGB 10.7 (L) 09/15/2022 1242   HCT 34.5 (L) 09/15/2022 1242   PLT 309 09/15/2022 1242   MCV 83.5 09/15/2022 1242   NEUTROABS 14.7 (H) 09/15/2022 1242   LYMPHSABS 0.7 09/15/2022 1242   MONOABS 0.8 09/15/2022 1242   EOSABS 0.0 09/15/2022 1242   BASOSABS 0.0 09/15/2022 1242   Comprehensive Metabolic Panel:    Component Value Date/Time   NA 138 09/15/2022 1242   K 4.2 09/15/2022 1242   CL 100 09/15/2022 1242   CO2 27 09/15/2022 1242   BUN 26 (H) 09/15/2022 1242   CREATININE 1.88 (H) 09/15/2022 1242   GLUCOSE 118 (H) 09/15/2022 1242   CALCIUM 8.2 (L) 09/15/2022 1242   AST 16 09/15/2022 1242   ALT 11 09/15/2022 1242   ALKPHOS 92 09/15/2022 1242   BILITOT 0.7 09/15/2022 1242   PROT 6.4 (L) 09/15/2022 1242   ALBUMIN 2.4 (L) 09/15/2022 1242   Review of Systems Unless otherwise noted, a complete review of systems is negative.  Physical Exam General: NAD. Frail elderly. Semi flat affect. Sitting in  examination chair HEENT: Pupils are round, equal and reactive to light and accomodation  Cardiovascular: regular rate and rhythm Pulmonary: Rhonchi and wheeze of right upper lung fields. Left lung with mild scattered wheezes throughout.  Abdomen: soft Extremities: no edema, no joint deformities Neurological: Weakness but otherwise non-focal. Cranial nerves appear intact.   Assessment and Plan- Patient is a 82 y.o. female    Encounter Diagnoses  Name Primary?   Neoplasm of uncertain behavior of lung Yes   Leukocytosis, unspecified type    Loss of appetite    Elevated serum creatinine    Community acquired pneumonia, unspecified laterality    Patient has suspected CAP in the setting of lung mass of unknown origin. She is unable to tolerate any meaningful oral intake of fluids/liquids/antibiotics. Declining in terms of ADLs with recent fall with head injury. New electrolyte abnormalities and continued elevation of creatinine level. She would benefit from inpatient management as without intervention I fear that she would further decline and risk becoming septic. Patient and daughters wish to have her seen at [facility] as she was recently referred there for assistance in cancer identification given her multiple inconclusive or negative biopsies. Her medical team here has been alerted. Patient's daughters will provide private transportation.    Patient expressed understanding and was in agreement with this plan. She also understands that She can call clinic at any time with any questions, concerns, or complaints.   Thank you for allowing me to participate in the care of this very pleasant patient.   Time Total: 25  Visit consisted of counseling and education dealing with the complex and emotionally intense issues of symptom management in the setting of serious illness.Greater than 50%  of this time was spent counseling and coordinating care related to the above assessment and  plan.  Signed by: Clent Jacks, PA-C

## 2022-09-17 ENCOUNTER — Ambulatory Visit: Payer: Medicare PPO

## 2022-09-18 ENCOUNTER — Inpatient Hospital Stay: Payer: Medicare PPO

## 2022-09-18 ENCOUNTER — Ambulatory Visit: Payer: Medicare PPO

## 2022-09-18 ENCOUNTER — Encounter: Payer: Self-pay | Admitting: *Deleted

## 2022-09-18 ENCOUNTER — Encounter: Payer: Self-pay | Admitting: Pulmonary Disease

## 2022-09-18 NOTE — Telephone Encounter (Signed)
Can I add her to your afternoon schedule Monday or Wednesday next week?

## 2022-09-18 NOTE — Telephone Encounter (Signed)
Yes, ok to reschedule as proposed.

## 2022-09-19 ENCOUNTER — Ambulatory Visit: Payer: Medicare PPO

## 2022-09-19 ENCOUNTER — Encounter: Payer: Self-pay | Admitting: *Deleted

## 2022-09-19 ENCOUNTER — Other Ambulatory Visit: Payer: Self-pay | Admitting: *Deleted

## 2022-09-19 ENCOUNTER — Inpatient Hospital Stay: Payer: Medicare PPO

## 2022-09-19 ENCOUNTER — Ambulatory Visit: Payer: Medicare PPO | Admitting: Pulmonary Disease

## 2022-09-19 DIAGNOSIS — D381 Neoplasm of uncertain behavior of trachea, bronchus and lung: Secondary | ICD-10-CM

## 2022-09-20 ENCOUNTER — Inpatient Hospital Stay: Payer: Medicare PPO | Admitting: Hospice and Palliative Medicine

## 2022-09-20 ENCOUNTER — Inpatient Hospital Stay: Payer: Medicare PPO

## 2022-09-20 ENCOUNTER — Inpatient Hospital Stay: Payer: Medicare PPO | Admitting: Oncology

## 2022-09-20 ENCOUNTER — Ambulatory Visit: Payer: Medicare PPO

## 2022-09-20 ENCOUNTER — Encounter: Payer: Self-pay | Admitting: *Deleted

## 2022-09-20 ENCOUNTER — Telehealth: Payer: Medicare PPO | Admitting: Hospice and Palliative Medicine

## 2022-09-20 ENCOUNTER — Inpatient Hospital Stay: Payer: Medicare PPO | Admitting: Medical Oncology

## 2022-09-21 ENCOUNTER — Ambulatory Visit: Payer: Medicare PPO

## 2022-09-21 ENCOUNTER — Telehealth: Payer: Self-pay

## 2022-09-21 NOTE — Telephone Encounter (Signed)
CSW attempted to contact patient to conduct psychosocial assessment.  Left vm.  CSW will attempt to meet with patient on Monday, 6/17, while she is in infusion.

## 2022-09-22 ENCOUNTER — Ambulatory Visit: Payer: Medicare PPO

## 2022-09-24 ENCOUNTER — Encounter: Payer: Self-pay | Admitting: Oncology

## 2022-09-24 ENCOUNTER — Encounter: Payer: Self-pay | Admitting: *Deleted

## 2022-09-25 ENCOUNTER — Ambulatory Visit: Payer: Medicare PPO

## 2022-09-25 ENCOUNTER — Inpatient Hospital Stay: Payer: Medicare PPO

## 2022-09-25 ENCOUNTER — Inpatient Hospital Stay: Payer: Medicare PPO | Admitting: Oncology

## 2022-09-25 ENCOUNTER — Inpatient Hospital Stay: Payer: Medicare PPO | Admitting: Hospice and Palliative Medicine

## 2022-09-25 NOTE — Telephone Encounter (Signed)
Please see other my chart message. Will close this encounter.

## 2022-09-26 ENCOUNTER — Ambulatory Visit: Payer: Medicare PPO

## 2022-09-26 ENCOUNTER — Encounter: Payer: Self-pay | Admitting: Pulmonary Disease

## 2022-09-26 ENCOUNTER — Encounter: Payer: Self-pay | Admitting: Hospice and Palliative Medicine

## 2022-09-26 LAB — ACID FAST CULTURE WITH REFLEXED SENSITIVITIES (MYCOBACTERIA): Acid Fast Culture: NEGATIVE

## 2022-09-27 ENCOUNTER — Ambulatory Visit: Payer: Medicare PPO | Admitting: Pulmonary Disease

## 2022-09-27 ENCOUNTER — Ambulatory Visit: Payer: Medicare PPO

## 2022-09-28 ENCOUNTER — Ambulatory Visit: Payer: Medicare PPO

## 2022-09-29 ENCOUNTER — Encounter: Payer: Self-pay | Admitting: *Deleted

## 2022-09-29 ENCOUNTER — Encounter: Payer: Self-pay | Admitting: Pulmonary Disease

## 2022-09-29 ENCOUNTER — Ambulatory Visit: Payer: Medicare PPO

## 2022-10-02 ENCOUNTER — Ambulatory Visit: Payer: Medicare PPO

## 2022-10-02 ENCOUNTER — Inpatient Hospital Stay: Payer: Medicare PPO

## 2022-10-02 ENCOUNTER — Telehealth: Payer: Self-pay | Admitting: *Deleted

## 2022-10-02 NOTE — Telephone Encounter (Signed)
Received message from pt's daughter, Sonny Masters, that pt is being discharged home from [facility] and will be transferred to hospice at home. Requests to cancel all appts at this time. Schedule message sent to cancel appts. Radiation made aware.

## 2022-10-03 ENCOUNTER — Ambulatory Visit: Payer: Medicare PPO

## 2022-10-04 ENCOUNTER — Ambulatory Visit: Payer: Medicare PPO

## 2022-10-04 ENCOUNTER — Inpatient Hospital Stay: Payer: Medicare PPO

## 2022-10-05 ENCOUNTER — Other Ambulatory Visit: Payer: Medicare PPO

## 2022-10-05 ENCOUNTER — Ambulatory Visit: Payer: Medicare PPO | Admitting: Internal Medicine

## 2022-10-05 ENCOUNTER — Encounter: Payer: Medicare PPO | Admitting: Hospice and Palliative Medicine

## 2022-10-05 ENCOUNTER — Ambulatory Visit: Payer: Medicare PPO

## 2022-10-05 ENCOUNTER — Ambulatory Visit: Payer: Medicare PPO | Admitting: Pulmonary Disease

## 2022-10-06 ENCOUNTER — Ambulatory Visit: Payer: Medicare PPO

## 2022-10-09 ENCOUNTER — Ambulatory Visit: Payer: Medicare PPO

## 2022-10-10 ENCOUNTER — Ambulatory Visit: Payer: Medicare PPO

## 2022-10-11 ENCOUNTER — Ambulatory Visit: Payer: Medicare PPO

## 2022-10-13 ENCOUNTER — Ambulatory Visit: Payer: Medicare PPO

## 2022-10-13 ENCOUNTER — Other Ambulatory Visit: Payer: Medicare PPO

## 2022-10-13 ENCOUNTER — Ambulatory Visit: Payer: Medicare PPO | Admitting: Internal Medicine

## 2022-10-16 ENCOUNTER — Ambulatory Visit: Payer: Medicare PPO

## 2022-10-17 ENCOUNTER — Encounter: Payer: Medicare PPO | Admitting: Hospice and Palliative Medicine

## 2022-10-17 ENCOUNTER — Ambulatory Visit: Payer: Medicare PPO

## 2022-10-18 ENCOUNTER — Ambulatory Visit: Payer: Medicare PPO

## 2022-10-19 ENCOUNTER — Ambulatory Visit: Payer: Medicare PPO

## 2022-10-20 ENCOUNTER — Ambulatory Visit: Payer: Medicare PPO

## 2022-10-23 ENCOUNTER — Ambulatory Visit: Payer: Medicare PPO

## 2022-10-24 ENCOUNTER — Ambulatory Visit: Payer: Medicare PPO

## 2022-10-24 ENCOUNTER — Encounter: Payer: Medicare PPO | Admitting: Thoracic Surgery (Cardiothoracic Vascular Surgery)

## 2022-10-25 ENCOUNTER — Ambulatory Visit: Payer: Medicare PPO

## 2022-10-26 ENCOUNTER — Ambulatory Visit: Payer: Medicare PPO

## 2022-10-27 ENCOUNTER — Ambulatory Visit: Payer: Medicare PPO

## 2022-10-30 ENCOUNTER — Ambulatory Visit: Payer: Medicare PPO

## 2022-10-31 ENCOUNTER — Ambulatory Visit: Payer: Medicare PPO

## 2022-11-01 ENCOUNTER — Ambulatory Visit: Payer: Medicare PPO

## 2022-11-09 DEATH — deceased
